# Patient Record
Sex: Female | Born: 1975 | Race: Black or African American | Hispanic: No | Marital: Married | Smoking: Never smoker
Health system: Southern US, Community
[De-identification: ages and names within clinical notes are randomized; demographics above are authoritative.]

## PROBLEM LIST (undated history)

## (undated) DIAGNOSIS — M069 Rheumatoid arthritis, unspecified: Secondary | ICD-10-CM

## (undated) DIAGNOSIS — C50919 Malignant neoplasm of unspecified site of unspecified female breast: Secondary | ICD-10-CM

## (undated) HISTORY — PX: FOOT SURGERY: SHX648

## (undated) HISTORY — DX: Malignant neoplasm of unspecified site of unspecified female breast: C50.919

## (undated) HISTORY — PX: TUBAL LIGATION: SHX77

---

## 2014-01-03 ENCOUNTER — Ambulatory Visit: Payer: Self-pay | Admitting: Podiatry

## 2014-01-17 ENCOUNTER — Ambulatory Visit: Payer: Self-pay | Admitting: Podiatry

## 2014-02-03 ENCOUNTER — Ambulatory Visit (INDEPENDENT_AMBULATORY_CARE_PROVIDER_SITE_OTHER): Payer: Managed Care, Other (non HMO) | Admitting: Podiatry

## 2014-02-03 ENCOUNTER — Ambulatory Visit (INDEPENDENT_AMBULATORY_CARE_PROVIDER_SITE_OTHER): Payer: Managed Care, Other (non HMO)

## 2014-02-03 ENCOUNTER — Encounter: Payer: Self-pay | Admitting: Podiatry

## 2014-02-03 VITALS — BP 123/87 | HR 70 | Resp 12

## 2014-02-03 DIAGNOSIS — R52 Pain, unspecified: Secondary | ICD-10-CM

## 2014-02-03 NOTE — Progress Notes (Signed)
Subjective:     Patient ID: Jaime Howard, female   DOB: 08-Sep-1976, 38 y.o.   MRN: [id number]  Toe Pain    patient states this right fifth toe is driving me crazy. States that she's had this for a long time and has had periodic injections and did have surgery 1 time a number of years ago which did not solve the problem. She would like to get it fixed permanently due to the continuation of discomfort in the toe   Review of Systems  All other systems reviewed and are negative.      Objective:   Physical Exam  Nursing note and vitals reviewed. Constitutional: She is oriented to person, place, and time.  Cardiovascular: Intact distal pulses.   Musculoskeletal: Normal range of motion.  Neurological: She is oriented to person, place, and time.  Skin: Skin is warm.   neurovascular status intact muscle strength adequate with range of motion of subtalar midtarsal joint within normal limits. Patient's right fifth toe is inflamed with keratotic lesion and fluid buildup and it very tender when pressed. 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 Discussed condition and treatment options and she wants this fixed but cannot do it for several months and is desperate for short-term relief. I reviewed arthroplasty and allowed her to read consent form since she drives an hour to get here and I did explain all possible complications as outlined and the told recovery. We'll take 6 months to one year. She wants surgery and signed consent form in today I did infiltrate 60 mg Xylocaine Marcaine mixture injected with a small amount of dexamethasone Kenalog 2 mg and debrided the lesion for temporary relief. Call with any questions she should have prior to surgery in June

## 2014-02-03 NOTE — Progress Notes (Signed)
   Subjective:    Patient ID: Jaime Howard, female    DOB: 02/14/1976, 38 y.o.   MRN: [id number]  HPI PT STATED RT FOOT 5TH TOE IS BEEN HURTING FOR 4 YEARS. THE TOE IS GETTING WORSE. THE TOE GET AGGRAVATED BY WEARING SHOES. TRIED CORTISONE SHOT AND IT HELPS SOME.    Review of Systems  All other systems reviewed and are negative.      Objective:   Physical Exam        Assessment & Plan:

## 2014-02-03 NOTE — Patient Instructions (Signed)
Pre-Operative Instructions  Congratulations, you have decided to take an important step to improving your quality of life.  You can be assured that the doctors of [facility] will be with you every step of the way.  1. Plan to be at the surgery center/hospital at least 1 (one) hour prior to your scheduled time unless otherwise directed by the surgical center/hospital staff.  You must have a responsible adult accompany you, remain during the surgery and drive you home.  Make sure you have directions to the surgical center/hospital and know how to get there on time. 2. For hospital based surgery you will need to obtain a history and physical form from your family physician within 1 month prior to the date of surgery- we will give you a form for you primary physician.  3. We make every effort to accommodate the date you request for surgery.  There are however, times where surgery dates or times have to be moved.  We will contact you as soon as possible if a change in schedule is required.   4. No Aspirin/Ibuprofen for one week before surgery.  If you are on aspirin, any non-steroidal anti-inflammatory medications (Mobic, Aleve, Ibuprofen) you should stop taking it 7 days prior to your surgery.  You make take Tylenol  For pain prior to surgery.  5. Medications- If you are taking daily heart and blood pressure medications, seizure, reflux, allergy, asthma, anxiety, pain or diabetes medications, make sure the surgery center/hospital is aware before the day of surgery so they may notify you which medications to take or avoid the day of surgery. 6. No food or drink after midnight the night before surgery unless directed otherwise by surgical center/hospital staff. 7. No alcoholic beverages 24 hours prior to surgery.  No smoking 24 hours prior to or 24 hours after surgery. 8. Wear loose pants or shorts- loose enough to fit over bandages, boots, and casts. 9. No slip on shoes, sneakers are best. 10. Bring  your boot with you to the surgery center/hospital.  Also bring crutches or a walker if your physician has prescribed it for you.  If you do not have this equipment, it will be provided for you after surgery. 11. If you have not been contracted by the surgery center/hospital by the day before your surgery, call to confirm the date and time of your surgery. 12. Leave-time from work may vary depending on the type of surgery you have.  Appropriate arrangements should be made prior to surgery with your employer. 13. Prescriptions will be provided immediately following surgery by your doctor.  Have these filled as soon as possible after surgery and take the medication as directed. 14. Remove nail polish on the operative foot. 15. Wash the night before surgery.  The night before surgery wash the foot and leg well with the antibacterial soap provided and water paying special attention to beneath the toenails and in between the toes.  Rinse thoroughly with water and dry well with a towel.  Perform this wash unless told not to do so by your physician.  Enclosed: 1 Ice pack (please put in freezer the night before surgery)   1 Hibiclens skin cleaner   Pre-op Instructions  If you have any questions regarding the instructions, do not hesitate to call our office.  [place]: [address] [place], [place] [address] [phone]  [place]: [address] [place], [place] [address] [phone]  [place]: [address]  [place], [place] [address] [phone]  Dr. Richard   Tuchman DPM, Dr. Norman Regal DPM Dr. Richard Sikora DPM, Dr. M. Todd Hyatt DPM, Dr. Kathryn Egerton DPM 

## 2014-04-11 ENCOUNTER — Encounter: Payer: Managed Care, Other (non HMO) | Admitting: Podiatry

## 2014-04-12 ENCOUNTER — Encounter: Payer: Self-pay | Admitting: Podiatry

## 2014-04-12 NOTE — Progress Notes (Signed)
Dr Paulla Dolly performed at right hammertoe 5th met repair on 04/05/14.

## 2020-09-04 ENCOUNTER — Other Ambulatory Visit: Payer: Self-pay

## 2020-09-04 ENCOUNTER — Encounter (INDEPENDENT_AMBULATORY_CARE_PROVIDER_SITE_OTHER): Payer: Self-pay | Admitting: Otolaryngology

## 2020-09-04 ENCOUNTER — Ambulatory Visit (INDEPENDENT_AMBULATORY_CARE_PROVIDER_SITE_OTHER): Payer: Managed Care, Other (non HMO) | Admitting: Otolaryngology

## 2020-09-04 VITALS — Temp 98.1°F

## 2020-09-04 DIAGNOSIS — J310 Chronic rhinitis: Secondary | ICD-10-CM | POA: Diagnosis not present

## 2020-09-04 DIAGNOSIS — J329 Chronic sinusitis, unspecified: Secondary | ICD-10-CM

## 2020-09-04 NOTE — Progress Notes (Signed)
HPI: Jaime Howard is a 44 y.o. female who presents for evaluation of chronic sinus infections.  She states that she normally gets 3-4 sinus infections every year but this past year she has had 5 infections.  She has just recently finished antibiotics and steroids. She apparently had Covid in November 2020. When she has a sinus infection she gets headache and pressure in her sinuses she has a stuffy nose.  She complains of postnasal drainage. She has never had any sinus x-rays. She just recently started the process of having a dental implant placed.  No past medical history on file.  Social History   Socioeconomic History  . Marital status: Married    Spouse name: Not on file  . Number of children: Not on file  . Years of education: Not on file  . Highest education level: Not on file  Occupational History  . Not on file  Tobacco Use  . Smoking status: Never Smoker  . Smokeless tobacco: Never Used  Substance and Sexual Activity  . Alcohol use: No  . Drug use: No  . Sexual activity: Not on file  Other Topics Concern  . Not on file  Social History Narrative  . Not on file   Social Determinants of Health   Financial Resource Strain:   . Difficulty of Paying Living Expenses: Not on file  Food Insecurity:   . Worried About Charity fundraiser in the Last Year: Not on file  . Ran Out of Food in the Last Year: Not on file  Transportation Needs:   . Lack of Transportation (Medical): Not on file  . Lack of Transportation (Non-Medical): Not on file  Physical Activity:   . Days of Exercise per Week: Not on file  . Minutes of Exercise per Session: Not on file  Stress:   . Feeling of Stress : Not on file  Social Connections:   . Frequency of Communication with Friends and Family: Not on file  . Frequency of Social Gatherings with Friends and Family: Not on file  . Attends Religious Services: Not on file  . Active Member of Clubs or Organizations: Not on file  . Attends Theatre manager Meetings: Not on file  . Marital Status: Not on file   No family history on file. No Known Allergies Prior to Admission medications   Not on File     Positive ROS: Otherwise negative  All other systems have been reviewed and were otherwise negative with the exception of those mentioned in the HPI and as above.  Physical Exam: Constitutional: Alert, well-appearing, no acute distress Ears: External ears without lesions or tenderness. Ear canals are clear bilaterally with intact, clear TMs.  Nasal: External nose without lesions. Septum deviated to the right with moderate rhinitis.  After decongesting the nose no polyps noted.  Both millimeters regions appear clear..  Oral: Lips and gums without lesions. Tongue and palate mucosa without lesions. Posterior oropharynx clear. Neck: No palpable adenopathy or masses Respiratory: Breathing comfortably  Skin: No facial/neck lesions or rash noted.  Procedures  Assessment: Chronic rhinitis. History of recurrent sinusitis.  Plan: Recommended regular use of Nasacort 2 sprays each nostril at night as this should help some with nasal congestion as well as sinus pressure. We will plan on obtaining a CT scan of the sinuses since she has had so frequent sinus infections. She will call us back following the CT scan of the sinuses.  Radene Journey, MD

## 2020-09-05 ENCOUNTER — Other Ambulatory Visit (INDEPENDENT_AMBULATORY_CARE_PROVIDER_SITE_OTHER): Payer: Self-pay

## 2020-09-05 DIAGNOSIS — J329 Chronic sinusitis, unspecified: Secondary | ICD-10-CM

## 2020-09-05 DIAGNOSIS — J320 Chronic maxillary sinusitis: Secondary | ICD-10-CM

## 2020-09-05 NOTE — Progress Notes (Signed)
Ct scan 

## 2020-09-29 ENCOUNTER — Other Ambulatory Visit: Payer: Managed Care, Other (non HMO)

## 2020-09-29 ENCOUNTER — Inpatient Hospital Stay: Admission: RE | Admit: 2020-09-29 | Payer: Managed Care, Other (non HMO) | Source: Ambulatory Visit

## 2020-10-02 ENCOUNTER — Ambulatory Visit
Admission: RE | Admit: 2020-10-02 | Discharge: 2020-10-02 | Disposition: A | Discharge status: Home / Self Care | Payer: Managed Care, Other (non HMO) | Source: Ambulatory Visit | Attending: Otolaryngology | Admitting: Otolaryngology

## 2020-10-02 DIAGNOSIS — J329 Chronic sinusitis, unspecified: Secondary | ICD-10-CM

## 2020-10-20 ENCOUNTER — Telehealth (INDEPENDENT_AMBULATORY_CARE_PROVIDER_SITE_OTHER): Payer: Self-pay | Admitting: Otolaryngology

## 2020-10-20 NOTE — Telephone Encounter (Signed)
Called patient today concerning results of her recent CT scan of her sinuses.  She states that she has had frequent sinus infections and is presently having "sinus headache". I have reviewed the CT scan of the sinuses and this showed clear paranasal sinuses.  There was no mucoperiosteal thickening no air-fluid levels and no opacification of the sinuses.  OMCs were widely patent throughout. I am not sure her "sinuses" are causing the symptoms that she is experiencing that this may be more related to just allergies and rhinitis.

## 2021-06-04 IMAGING — CT CT MAXILLOFACIAL W/O CM
1 series · 16 of 30 positions shown, 20 images · non-contrast
Comparison: None.

CLINICAL DATA: Maxillofacial pain.  Chronic sinusitis.

EXAM:
CT MAXILLOFACIAL WITHOUT CONTRAST
TECHNIQUE: Multidetector CT images of the paranasal sinuses were obtained using
the standard protocol without intravenous contrast.

[Series 4: soft tissue · axial · 0.36mm/px · z∈[-39,+69]mm · 16 of 117 slices shown, 20 images]
[im 5/117  brain]
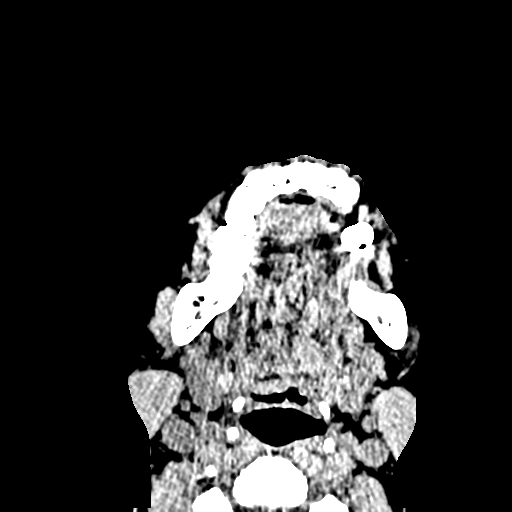
[im 5/117  bone]
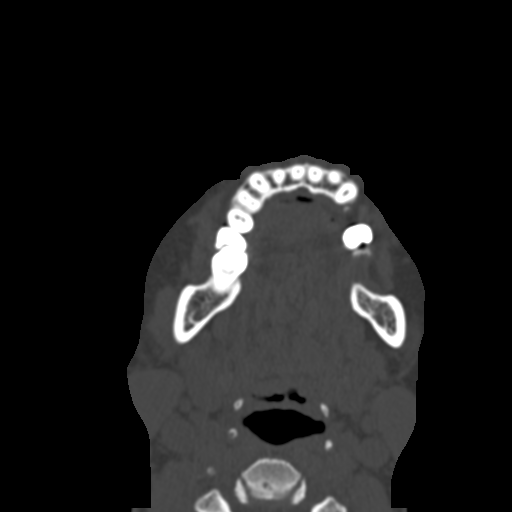
[im 13/117  bone]
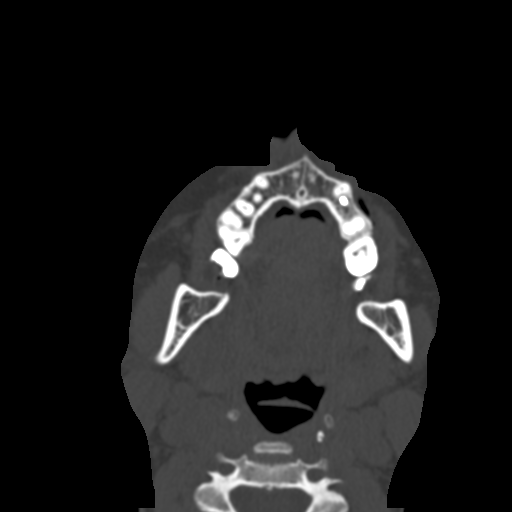
[im 21/117  bone]
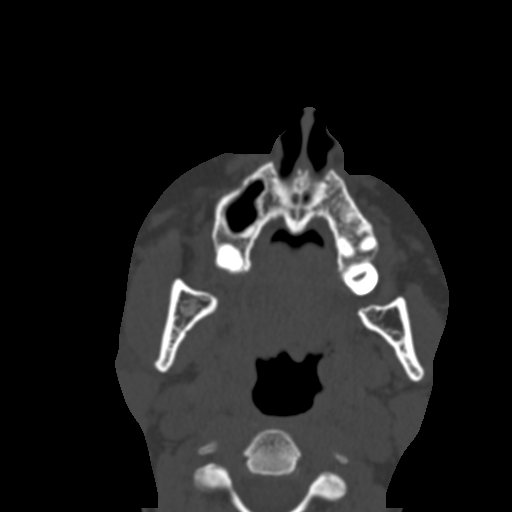
[im 29/117  bone]
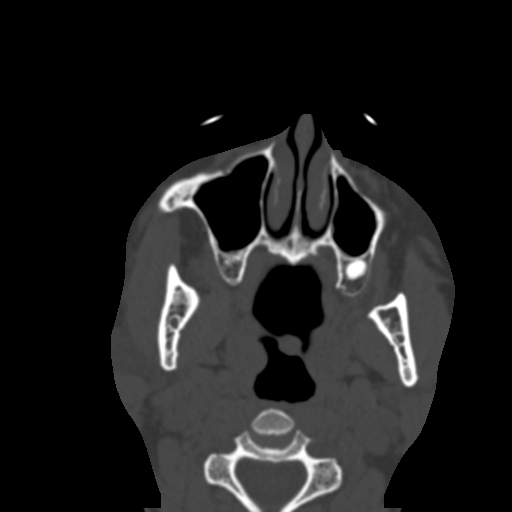
[im 33/117  brain]
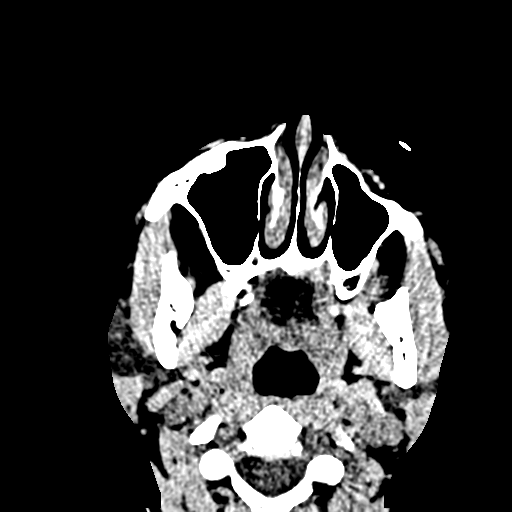
[im 33/117  bone]
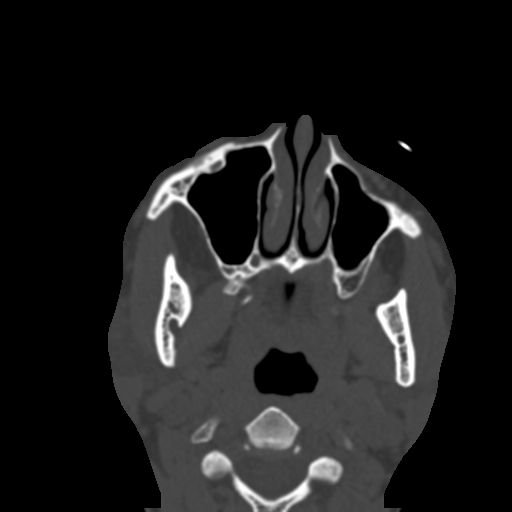
[im 41/117  bone]
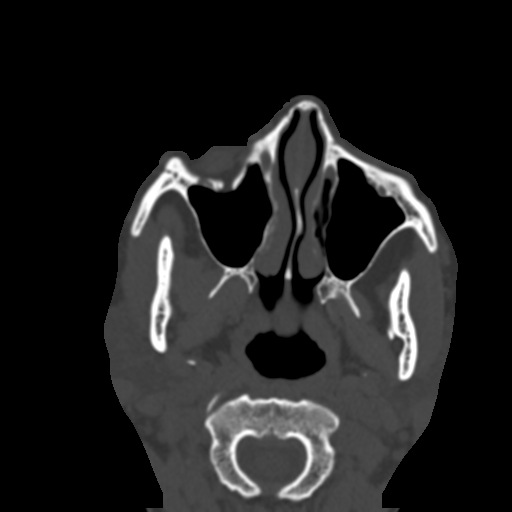
[im 49/117  bone]
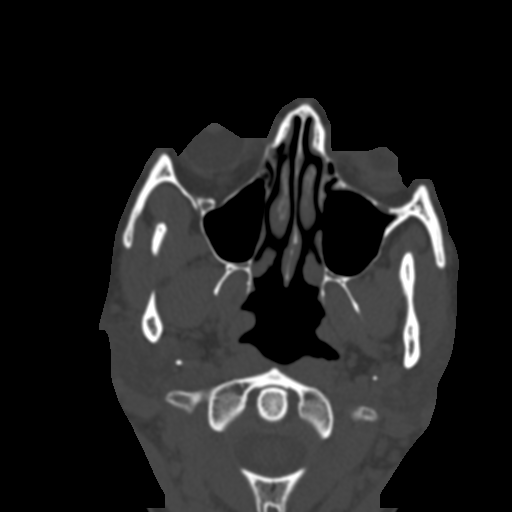
[im 57/117  bone]
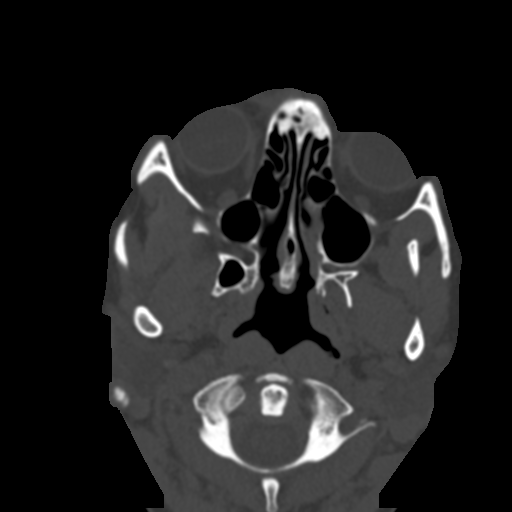
[im 61/117  brain]
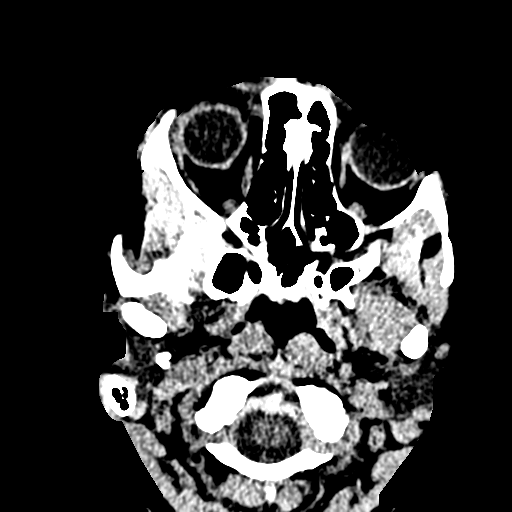
[im 61/117  bone]
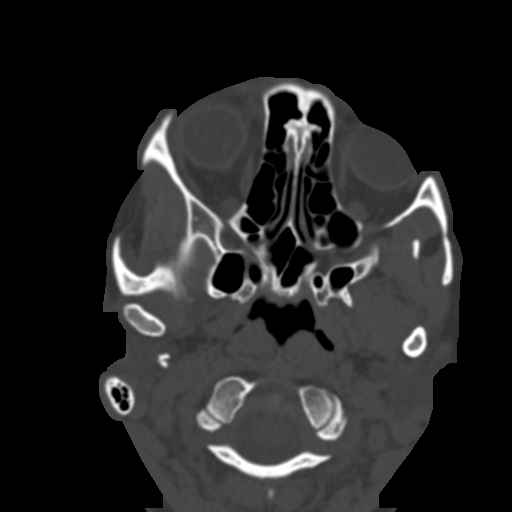
[im 69/117  bone]
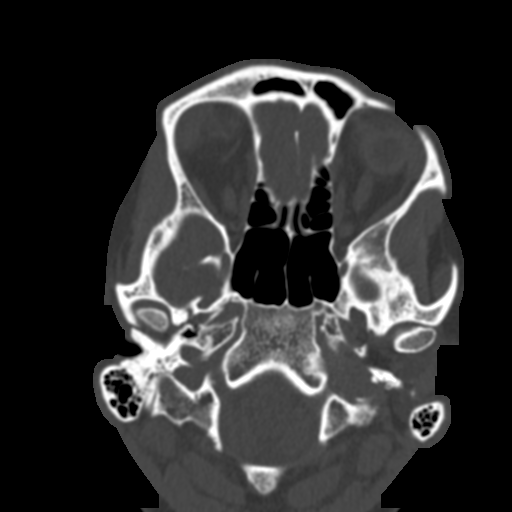
[im 77/117  bone]
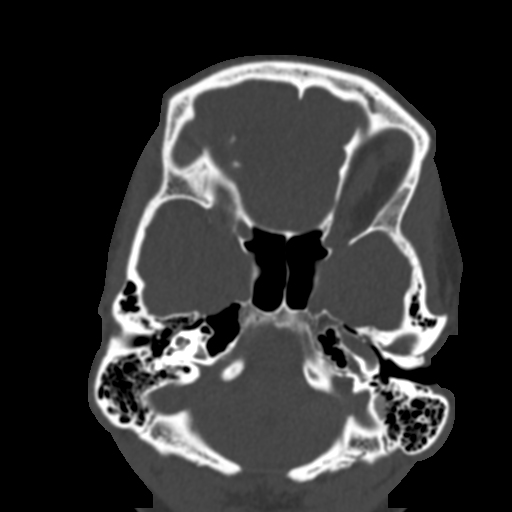
[im 85/117  bone]
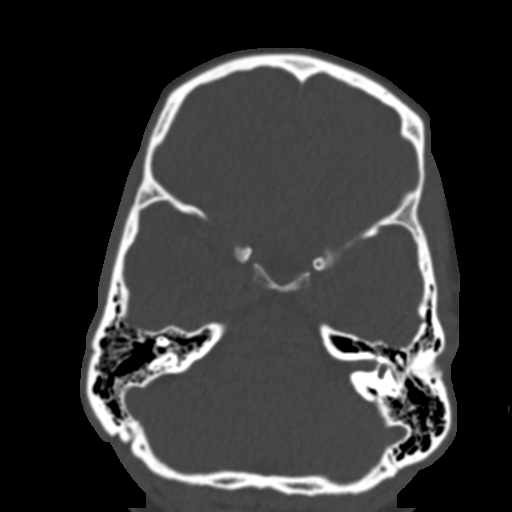
[im 89/117  brain]
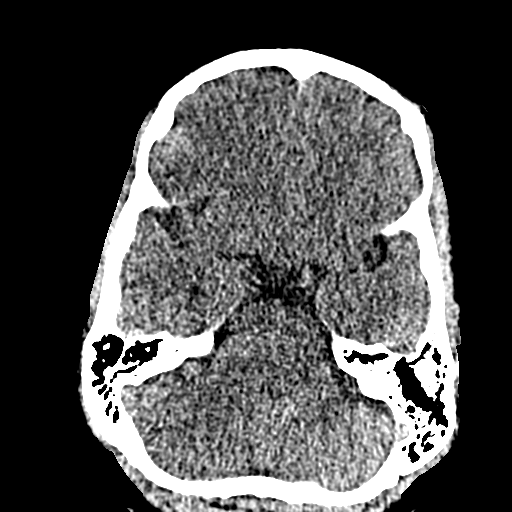
[im 89/117  bone]
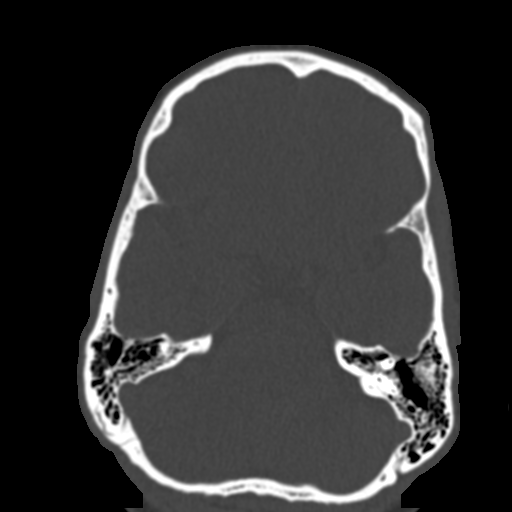
[im 97/117  bone]
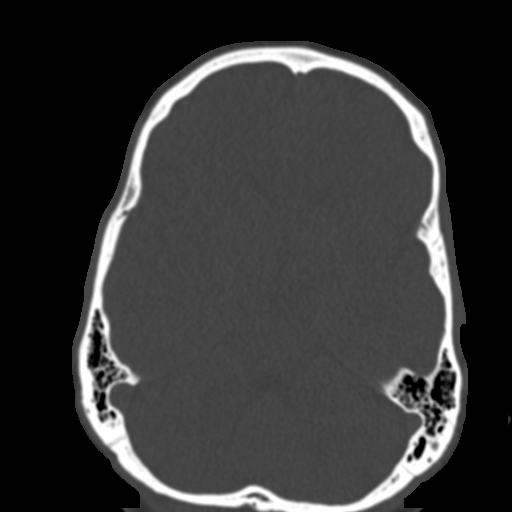
[im 105/117  bone]
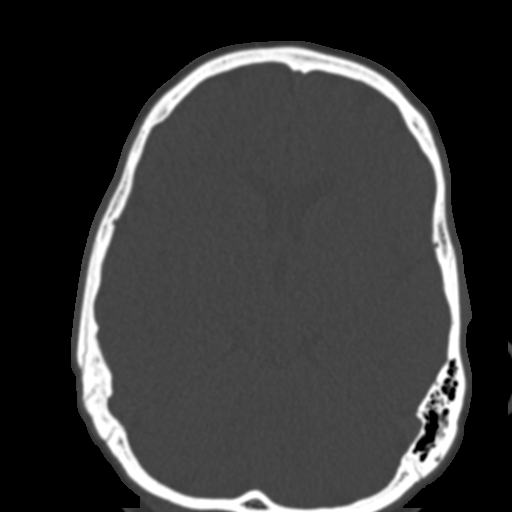
[im 113/117  bone]
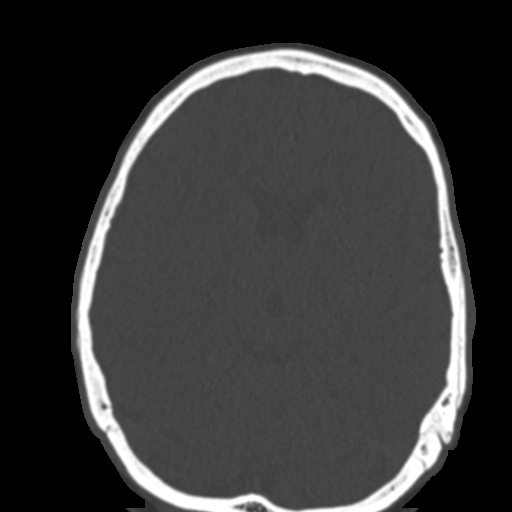

[16 of 30 positions shown; findings below may reference images not displayed]

FINDINGS: Paranasal sinuses:

Frontal: Normally aerated. Patent frontal sinus drainage pathways.

Ethmoid: Normally aerated.

Maxillary: Normally aerated.

Sphenoid: Normally aerated. Patent sphenoethmoidal recesses.

Right ostiomeatal unit: Patent.

Left ostiomeatal unit: Patent.

Nasal passages: Patent. Intact nasal septum is midline.

Anatomy: No pneumatization superior to anterior ethmoid notches.
Symmetric and intact olfactory grooves and fovea ethmoidalis, Keros
II (4-7mm). Sellar sphenoid pneumatization pattern. No dehiscence of
carotid or optic canals. No onodi cell.

Other: Orbits and intracranial compartment are unremarkable. Visible
mastoid air cells are normally aerated.
IMPRESSION: Normally aerated paranasal sinuses. Patent sinus drainage pathways.

## 2022-03-21 ENCOUNTER — Telehealth: Payer: Self-pay | Admitting: *Deleted

## 2022-03-21 ENCOUNTER — Encounter: Payer: Self-pay | Admitting: *Deleted

## 2022-03-21 DIAGNOSIS — D0512 Intraductal carcinoma in situ of left breast: Secondary | ICD-10-CM

## 2022-03-21 NOTE — Telephone Encounter (Signed)
Left message for a return phone call to scheduled [facility] appt for 6/14

## 2022-03-21 NOTE — Telephone Encounter (Signed)
Received return phone call from patient and confirmed appt for Jaime Howard - [facility] 03/27/22 at 12:15pm.  Discussed navigation resources and provided contact information. New patient packet emailed to patient. Encouraged her to call should she have any questions or concerns. Patient verbalized understanding.

## 2022-03-22 ENCOUNTER — Encounter: Payer: Self-pay | Admitting: Obstetrics and Gynecology

## 2022-03-26 NOTE — Progress Notes (Signed)
Radiation Oncology         [phone] ________________________________  Multidisciplinary Breast Oncology Clinic [facility]) Initial Outpatient Consultation  Name: Jaime Howard MRN: [id number]  Date: 03/27/2022  DOB: May 13, 1976  OV:FIEPP-IRJJOA, Tresa Res, MD  Rolm Bookbinder, MD   REFERRING PHYSICIAN: Rolm Bookbinder, MD  DIAGNOSIS: There were no encounter diagnoses.  Stage *** Left Breast ***Q, *** Carcinoma, ER*** / PR*** / Her2***, Grade ***  No diagnosis found.  HISTORY OF PRESENT ILLNESS::Jaime Howard is a 46 y.o. female who is presenting to the office today for evaluation of her newly diagnosed breast cancer. She is accompanied by ***. She is doing well overall.   She had routine screening mammography on 02/27/22 showing a possible abnormality in the left breast. She underwent unilateral left breast diagnostic mammography with tomography at [facility] on 03/06/22 showing: segmental pleomorphic calcifications in the left breast suspicious of malignancy, measuring 4 cm.  Biopsy on *** showed: ***. Prognostic indicators significant for: estrogen receptor, ***% {positive or negative} and progesterone receptor, ***% {positive or negative}, both with {include note beside percentage here/strong staining intensity}. Proliferation marker Ki67 at ***%. HER2 {positive or negative}.  Menarche: *** years old Age at first live birth: *** years old GP: *** LMP: *** Contraceptive: *** HRT: ***   The patient was referred today for presentation in the multidisciplinary conference.  Radiology studies and pathology slides were presented there for review and discussion of treatment options.  A consensus was discussed regarding potential next steps.  PREVIOUS RADIATION THERAPY: {EXAM; YES/NO:19492::"No"}  PAST MEDICAL HISTORY: No past medical history on file.  PAST SURGICAL HISTORY:*** The histories are not reviewed yet. Please review them in the "History" navigator section and refresh this  [place].  FAMILY HISTORY: No family history on file.  SOCIAL HISTORY:  Social History   Socioeconomic History   Marital status: Married    Spouse name: Not on file   Number of children: Not on file   Years of education: Not on file   Highest education level: Not on file  Occupational History   Not on file  Tobacco Use   Smoking status: Never   Smokeless tobacco: Never  Substance and Sexual Activity   Alcohol use: No   Drug use: No   Sexual activity: Not on file  Other Topics Concern   Not on file  Social History Narrative   Not on file   Social Determinants of Health   Financial Resource Strain: Not on file  Food Insecurity: Not on file  Transportation Needs: Not on file  Physical Activity: Not on file  Stress: Not on file  Social Connections: Not on file    ALLERGIES: No Known Allergies  MEDICATIONS:  No current outpatient medications on file.   No current facility-administered medications for this encounter.    REVIEW OF SYSTEMS: A 10+ POINT REVIEW OF SYSTEMS WAS OBTAINED including neurology, dermatology, psychiatry, cardiac, respiratory, lymph, extremities, GI, GU, musculoskeletal, constitutional, reproductive, HEENT. On the provided form, she reports ***. She denies *** and any other symptoms.    PHYSICAL EXAM:  vitals were not taken for this visit.  {may need to copy over vitals} Lungs are clear to auscultation bilaterally. Heart has regular rate and rhythm. No palpable cervical, supraclavicular, or axillary adenopathy. Abdomen soft, non-tender, normal bowel sounds. Breast: *** breast with no palpable mass, nipple discharge, or bleeding. *** breast with ***.   KPS = ***  100 - Normal; no complaints; no evidence of disease. 90   - Able  to carry on normal activity; minor signs or symptoms of disease. 80   - Normal activity with effort; some signs or symptoms of disease. 23   - Cares for self; unable to carry on normal activity or to do active work. 60   -  Requires occasional assistance, but is able to care for most of his personal needs. 50   - Requires considerable assistance and frequent medical care. 34   - Disabled; requires special care and assistance. 26   - Severely disabled; hospital admission is indicated although death not imminent. 2   - Very sick; hospital admission necessary; active supportive treatment necessary. 10   - Moribund; fatal processes progressing rapidly. 0     - Dead  Karnofsky DA, Abelmann WH, Craver LS and Burchenal JH [phone]) The use of the nitrogen mustards in the palliative treatment of carcinoma: with particular reference to bronchogenic carcinoma Cancer 1 634-56  LABORATORY DATA:  No results found for: "WBC", "HGB", "HCT", "MCV", "PLT" No results found for: "NA", "K", "CL", "CO2" No results found for: "ALT", "AST", "GGT", "ALKPHOS", "BILITOT"  PULMONARY FUNCTION TEST:   Review Flowsheet        No data to display          RADIOGRAPHY: No results found.    IMPRESSION: *** {DIAGNOSIS HERE}  Patient will be a good candidate for breast conservation with radiotherapy to *** breast. We discussed the general course of radiation, potential side effects, and toxicities with radiation and the patient is interested in this approach. ***   PLAN:  *** (copy notes from board at conference)   ------------------------------------------------  Blair Promise, PhD, MD  This document serves as a record of services personally performed by Gery Pray, MD. It was created on his behalf by Roney Mans, a trained medical scribe. The creation of this record is based on the scribe's personal observations and the provider's statements to them. This document has been checked and approved by the attending provider.

## 2022-03-27 ENCOUNTER — Ambulatory Visit: Payer: Managed Care, Other (non HMO) | Admitting: Physical Therapy

## 2022-03-27 ENCOUNTER — Encounter: Payer: Self-pay | Admitting: Hematology

## 2022-03-27 ENCOUNTER — Other Ambulatory Visit: Payer: Self-pay

## 2022-03-27 ENCOUNTER — Encounter: Payer: Self-pay | Admitting: Radiology

## 2022-03-27 ENCOUNTER — Ambulatory Visit
Admission: RE | Admit: 2022-03-27 | Discharge: 2022-03-27 | Disposition: A | Discharge status: Home / Self Care | Payer: Managed Care, Other (non HMO) | Source: Ambulatory Visit | Attending: Radiation Oncology | Admitting: Radiation Oncology

## 2022-03-27 ENCOUNTER — Other Ambulatory Visit: Payer: Self-pay | Admitting: General Surgery

## 2022-03-27 ENCOUNTER — Inpatient Hospital Stay: Payer: Managed Care, Other (non HMO) | Attending: Hematology | Admitting: Hematology

## 2022-03-27 ENCOUNTER — Inpatient Hospital Stay: Payer: Managed Care, Other (non HMO)

## 2022-03-27 ENCOUNTER — Inpatient Hospital Stay (HOSPITAL_BASED_OUTPATIENT_CLINIC_OR_DEPARTMENT_OTHER): Payer: Managed Care, Other (non HMO) | Admitting: Genetic Counselor

## 2022-03-27 VITALS — BP 121/83 | HR 64 | Temp 97.7°F | Resp 18 | Wt 142.1 lb

## 2022-03-27 DIAGNOSIS — D0512 Intraductal carcinoma in situ of left breast: Secondary | ICD-10-CM

## 2022-03-27 DIAGNOSIS — Z170 Estrogen receptor positive status [ER+]: Secondary | ICD-10-CM | POA: Diagnosis not present

## 2022-03-27 DIAGNOSIS — M069 Rheumatoid arthritis, unspecified: Secondary | ICD-10-CM | POA: Insufficient documentation

## 2022-03-27 DIAGNOSIS — Z800 Family history of malignant neoplasm of digestive organs: Secondary | ICD-10-CM

## 2022-03-27 LAB — CBC WITH DIFFERENTIAL (CANCER CENTER ONLY)
Abs Immature Granulocytes: 0.01 10*3/uL (ref 0.00–0.07)
Basophils Absolute: 0 10*3/uL (ref 0.0–0.1)
Basophils Relative: 1 %
Eosinophils Absolute: 0.1 10*3/uL (ref 0.0–0.5)
Eosinophils Relative: 1 %
HCT: 44.9 % (ref 36.0–46.0)
Hemoglobin: 15.1 g/dL — ABNORMAL HIGH (ref 12.0–15.0)
Immature Granulocytes: 0 %
Lymphocytes Relative: 28 %
Lymphs Abs: 1.9 10*3/uL (ref 0.7–4.0)
MCH: 29.4 pg (ref 26.0–34.0)
MCHC: 33.6 g/dL (ref 30.0–36.0)
MCV: 87.4 fL (ref 80.0–100.0)
Monocytes Absolute: 0.5 10*3/uL (ref 0.1–1.0)
Monocytes Relative: 7 %
Neutro Abs: 4.5 10*3/uL (ref 1.7–7.7)
Neutrophils Relative %: 63 %
Platelet Count: 182 10*3/uL (ref 150–400)
RBC: 5.14 MIL/uL — ABNORMAL HIGH (ref 3.87–5.11)
RDW: 12.5 % (ref 11.5–15.5)
WBC Count: 7 10*3/uL (ref 4.0–10.5)
nRBC: 0 % (ref 0.0–0.2)

## 2022-03-27 LAB — GENETIC SCREENING ORDER

## 2022-03-27 LAB — CMP (CANCER CENTER ONLY)
ALT: 7 U/L (ref 0–44)
AST: 14 U/L — ABNORMAL LOW (ref 15–41)
Albumin: 4.5 g/dL (ref 3.5–5.0)
Alkaline Phosphatase: 58 U/L (ref 38–126)
Anion gap: 7 (ref 5–15)
BUN: 12 mg/dL (ref 6–20)
CO2: 27 mmol/L (ref 22–32)
Calcium: 9.7 mg/dL (ref 8.9–10.3)
Chloride: 104 mmol/L (ref 98–111)
Creatinine: 0.88 mg/dL (ref 0.44–1.00)
GFR, Estimated: >60 mL/min (ref >60)
Glucose, Bld: 75 mg/dL (ref 70–99)
Potassium: 3.6 mmol/L (ref 3.5–5.1)
Sodium: 138 mmol/L (ref 135–145)
Total Bilirubin: 0.5 mg/dL (ref 0.3–1.2)
Total Protein: 7.9 g/dL (ref 6.5–8.1)

## 2022-03-27 NOTE — Progress Notes (Signed)
REFERRING PROVIDER: Truitt Merle, MD [address] [place],  [address]  PRIMARY PROVIDER:  Arlan Organ, MD  PRIMARY REASON FOR VISIT:  Encounter Diagnoses  Name Primary?   Ductal carcinoma in situ (DCIS) of left breast Yes   Family history of colon cancer     HISTORY OF PRESENT ILLNESS:   Ms. Chamorro, a 46 y.o. female, was seen for a [place] cancer genetics consultation during the breast multidisciplinary clinic at the request of Dr. Burr Medico due to a personal and family history of cancer.  Ms. Deoliveira presents to clinic today to discuss the possibility of a hereditary predisposition to cancer, to discuss genetic testing, and to further clarify her future cancer risks, as well as potential cancer risks for family members.   In June 2023, at the age of 53, Ms. Mccown was diagnosed with ductal carcinoma in situ of the left breast.   CANCER HISTORY:  Oncology History  Ductal carcinoma in situ (DCIS) of left breast  03/06/2022 Mammogram   Exam: 3D Mammogram Diagnostic - Left  IMPRESSION: The 4 cm segmental pleomorphic calcifications in the left breast at 3-4 o'clock are suspicious of malignancy.   03/18/2022 Initial Biopsy   Diagnosis 1. Breast, left, needle core biopsy, calcification 3-4:00 middle depth  - DUCTAL CARCINOMA IN SITU (DCIS), APOCRINE TYPE, NUCLEAR GRADE I-II, WITH CALCIFICATIONS AND CENTRAL NECROSIS  1. PROGNOSTIC INDICATORS Results: Estrogen Receptor: 90%, POSITIVE, STRONG STAINING INTENSITY Progesterone Receptor: 50%, POSITIVE, STRONG STAINING INTENSITY   03/21/2022 Initial Diagnosis   Ductal carcinoma in situ (DCIS) of left breast     Past Medical History:  Diagnosis Date   Breast cancer [facility])     Past Surgical History:  Procedure Laterality Date   TUBAL LIGATION  11/1998    Social History   Socioeconomic History   Marital status: Married    Spouse name: Not on file   Number of children: 2   Years of education: Not on file    Highest education level: Not on file  Occupational History   Not on file  Tobacco Use   Smoking status: Never   Smokeless tobacco: Never  Substance and Sexual Activity   Alcohol use: No   Drug use: No   Sexual activity: Yes    Birth control/protection: None  Other Topics Concern   Not on file  Social History Narrative   Not on file   Social Determinants of Health   Financial Resource Strain: Not on file  Food Insecurity: Not on file  Transportation Needs: Not on file  Physical Activity: Not on file  Stress: Not on file  Social Connections: Not on file     FAMILY HISTORY:  We obtained a detailed, 4-generation family history.  Significant diagnoses are listed below: Family History  Problem Relation Age of Onset   Cancer Father 75       colon cancer      Ms. Feria's maternal cousin was diagnosed with breast cancer in her 17s. Her father was diagnosed with colon cancer at age 60 and died due to metastatic colon cancer at age 78. Ms. Suits is unaware of previous family history of genetic testing for hereditary cancer risks. There is no reported Ashkenazi Jewish ancestry.   GENETIC COUNSELING ASSESSMENT: Ms. Seifried is a 46 y.o. female with a personal and family history of cancer which is somewhat suggestive of a hereditary cancer syndrome and predisposition to cancer given her young age at diagnosis. We, therefore, discussed and recommended the following  at today's visit.   DISCUSSION: We discussed that 5 - 10% of cancer is hereditary, with most cases of hereditary breast cancer associated with mutations in BRCA1/2.  There are other genes that can be associated with hereditary breast cancer syndromes. Type of cancer risk and level of risk are gene-specific. We discussed that testing is beneficial for several reasons including knowing how to follow individuals after completing their treatment, identifying whether potential treatment options would be beneficial, and understanding if  other family members could be at risk for cancer and allowing them to undergo genetic testing.   We reviewed the characteristics, features and inheritance patterns of hereditary cancer syndromes. We also discussed genetic testing, including the appropriate family members to test, the process of testing, insurance coverage and turn-around-time for results. We discussed the implications of a negative, positive and/or variant of uncertain significant result. In order to get genetic test results in a timely manner so that Ms. Pyeatt can use these genetic test results for surgical decisions, we recommended Ms. Iyer pursue genetic testing for the BRCAplus. Once complete, we recommend Ms. Vanderford pursue reflex genetic testing to a more comprehensive gene panel.   Ms. Harrigan  was offered a common hereditary cancer panel (47 genes) and an expanded pan-cancer panel (77 genes). Ms. Cogan was informed of the benefits and limitations of each panel, including that expanded pan-cancer panels contain genes that do not have clear management guidelines at this point in time.  We also discussed that as the number of genes included on a panel increases, the chances of variants of uncertain significance increases.  After considering the benefits and limitations of each gene panel, Ms. Besaw elected to have [facility] CancerNext-Expanded Panel.  The CancerNext-Expanded gene panel offered by [facility] and includes sequencing, rearrangement, and RNA analysis for the following 77 genes: AIP, ALK, APC, ATM, AXIN2, BAP1, BARD1, BLM, BMPR1A, BRCA1, BRCA2, BRIP1, CDC73, CDH1, CDK4, CDKN1B, CDKN2A, CHEK2, CTNNA1, DICER1, FANCC, FH, FLCN, GALNT12, KIF1B, LZTR1, MAX, MEN1, MET, MLH1, MSH2, MSH3, MSH6, MUTYH, NBN, NF1, NF2, NTHL1, PALB2, PHOX2B, PMS2, POT1, PRKAR1A, PTCH1, PTEN, RAD51C, RAD51D, RB1, RECQL, RET, SDHA, SDHAF2, SDHB, SDHC, SDHD, SMAD4, SMARCA4, SMARCB1, SMARCE1, STK11, SUFU, TMEM127, TP53, TSC1, TSC2, VHL and XRCC2  (sequencing and deletion/duplication); EGFR, EGLN1, HOXB13, KIT, MITF, PDGFRA, POLD1, and POLE (sequencing only); EPCAM and GREM1 (deletion/duplication only).   Based on Ms. Halterman's personal and family history of cancer, she meets medical criteria for genetic testing. Despite that she meets criteria, she may still have an out of pocket cost. We discussed that if her out of pocket cost for testing is over $100, the laboratory should contact them to discuss self-pay prices, patient pay assistance programs, if applicable, and other billing options.   PLAN: After considering the risks, benefits, and limitations, Ms. Lukins provided informed consent to pursue genetic testing and the blood sample was sent to [facility] for analysis of the CancerNext-Expanded Panel. Results should be available within approximately 1-2 weeks' time, at which point they will be disclosed by telephone to Ms. Kotz, as will any additional recommendations warranted by these results. Ms. Mcmullan will receive a summary of her genetic counseling visit and a copy of her results once available. This information will also be available in Epic.   Ms. Newmann questions were answered to her satisfaction today. Our contact information was provided should additional questions or concerns arise. Thank you for the referral and allowing [place] to share in the care of your patient.   Mel Almond  Jessyka Austria, [place], [facility] Genetic Counselor [email or web address] (P) [phone]  The patient was seen for a total of 20 minutes in face-to-face genetic counseling.  The patient brought her husband. Drs. Lindi Adie and/or Burr Medico were available to discuss this case as needed.  _______________________________________________________________________ For Office Staff:  Number of people involved in session: 2 Was an Intern/ student involved with case: no

## 2022-03-27 NOTE — Research (Signed)
MTG-015 - Tissue and Bodily Fluids: Translational Medicine: Discovery and Evaluation of Biomarkers/Pharmacogenomics for the Diagnosis and Personalized Management of Patients    03/27/22  CONSENT INTRO: Patient Jaime Howard was identified by Dr. Burr Medico as a potential candidate for the above listed study.  This Clinical Research Coordinator met with Jaime Howard, [id number], on 03/27/22 in a manner and location that ensures patient privacy to discuss participation in the above listed research study.  Patient is Accompanied by her husband Jaime Howard .  A copy of the informed consent document and separate HIPAA Authorization was provided to the patient.  Patient reads, speaks, and understands [place].   Patient was provided with the business card of this Coordinator and encouraged to contact the research team with any questions.  Approximately 10 minutes were spent with the patient reviewing the informed consent documents.  Patient was provided the option of taking informed consent documents home to review and was encouraged to review at their convenience with their support network, including other care providers. Patient took the consent documents home to review.Plan to contact patient later this week to discuss potential questions or interest. Patient and husband were thanked for their time.   Carol Ada, RT(R)(T) Clinical Research Coordinator

## 2022-03-27 NOTE — Progress Notes (Signed)
[place]   Telephone:([phone] Fax:([phone] [place] Note   Patient Care Team: Arlan Organ, MD as PCP - General (Obstetrics and Gynecology) Rockwell Germany, RN as Oncology Nurse Navigator Mauro Kaufmann, RN as Oncology Nurse Navigator Rolm Bookbinder, MD as Consulting Physician (General Surgery) Truitt Merle, MD as Consulting Physician (Hematology) Gery Pray, MD as Consulting Physician (Radiation Oncology)  Date of Service:  03/27/2022   CHIEF COMPLAINTS/PURPOSE OF CONSULTATION:  Left Breast DCIS, ER+  REFERRING PHYSICIAN:  [facility]   ASSESSMENT & PLAN:  Jaime Howard is a 46 y.o. pre/peri?-menopausal female with a history of RA  1. Left breast DCIS, grade 1-2, ER+/PR+ -found on screening mammogram. Left diagnostic MM 03/06/22 showed 4 cm calcifications. Biopsy on 03/18/22 confirmed DCIS. -I discussed her breast imaging and needle biopsy results with patient and her family members in great detail. -She is a candidate for breast conservation surgery. She has been seen by breast surgeon Dr. Donne Hazel today, who recommends lumpectomy. -Her DCIS will be cured by complete surgical resection. Any form of adjuvant therapy is preventive. -I reviewed her risk and treatment benefits using the Breast Cancer Nomogram from [facility]). Based on family, PMx and lifestyle, she has a 17% risk of developing future breast cancer in the next 10 years and roughly 30% lifetime risk given her young age. Her risk would drop to 7% with RT alone or 9% with Antiestrogen therapy alone. With both adjuvant treatments her risk would decrease to 3% in 10 years and roughly 10% in her lifetime.  -Given her strongly positive ER and PR, I recommend antiestrogen therapy with tamoxifen, which decrease her risk of future breast cancer by ~40%.  Given the recent clinical data, I recommend a low-dose 10 mg daily for 5 years --The potential  side effects, which includes but not limited to, hot flash, skin and vaginal dryness, slightly increased risk of cardiovascular disease and cataract, small risk of thrombosis and endometrial cancer, were discussed with her in great details. Preventive strategies for thrombosis, such as being physically active, using compression stocks, avoid cigarette smoking, etc., were reviewed with her. I also recommend her to follow-up with her gynecologist once a year, and watch for abnormal vaginal spotting or bleeding, as a clinically sign of endometrial cancer, etc. She voiced good understanding, and is interested -She will likely benefit from breast radiation if she undergo lumpectomy to decrease the risk of breast cancer. She will discuss this further with Dr. Lisbeth Renshaw today.  -We also discussed that biopsy may have sampling limitation, we will review her surgical path, to see if she has any invasive carcinoma components. -We discussed breast cancer surveillance after she completes treatment, Including annual mammogram, breast exam every 6-12 months.   PLAN:  -proceed with lumpectomy -f/u at the end of radiation, to start her on tamoxifen.   Oncology History  Ductal carcinoma in situ (DCIS) of left breast  03/06/2022 Mammogram   Exam: 3D Mammogram Diagnostic - Left  IMPRESSION: The 4 cm segmental pleomorphic calcifications in the left breast at 3-4 o'clock are suspicious of malignancy.   03/18/2022 Initial Biopsy   Diagnosis 1. Breast, left, needle core biopsy, calcification 3-4:00 middle depth  - DUCTAL CARCINOMA IN SITU (DCIS), APOCRINE TYPE, NUCLEAR GRADE I-II, WITH CALCIFICATIONS AND CENTRAL NECROSIS  1. PROGNOSTIC INDICATORS Results: Estrogen Receptor: 90%, POSITIVE, STRONG STAINING INTENSITY Progesterone Receptor: 50%, POSITIVE, STRONG STAINING INTENSITY   03/21/2022 Initial Diagnosis   Ductal carcinoma  in situ (DCIS) of left breast      HISTORY OF PRESENTING ILLNESS:  Jaime Howard 46  y.o. female is a here because of breast cancer. The patient was referred by [facility]. The patient presents to the clinic today accompanied by her husband.   She had routine screening mammography on 02/27/22 showing a possible abnormality in the left breast. She underwent left diagnostic mammography on 03/06/22 showing: 4 cm segmental calcifications at 3-4 o'clock.  Biopsy on 03/18/22 showed: DCIS, grade 1-2. Prognostic indicators significant for: estrogen receptor, 90% positive and progesterone receptor, 50% positive.    Today the patient notes they felt/feeling prior/after... -she notes some itching to the biopsy site -otherwise, no other breast or any other concerns  She has a PMHx of.... -rheumatoid arthritis, not currently on medication-- previously on methotrexate, maloxicam, has come off with change in diet  Socially... -she works as a Investment banker, operational for people with IDD -she is married with two children -she reports colon cancer in her father -never smoker, does not drink alcohol   GYN HISTORY  Menarchal: 46 years old LMP: 03/18/22, starting to become irregular Contraceptive: prior use of pill for several years, now s/p BTL HRT: n/a G2P2: first at age 24   REVIEW OF SYSTEMS:    Constitutional: Denies fevers, chills or abnormal night sweats Eyes: Denies blurriness of vision, double vision or watery eyes Ears, nose, mouth, throat, and face: Denies mucositis or sore throat Respiratory: Denies cough, dyspnea or wheezes Cardiovascular: Denies palpitation, chest discomfort or lower extremity swelling Gastrointestinal:  Denies nausea, heartburn or change in bowel habits Skin: Denies abnormal skin rashes Lymphatics: Denies new lymphadenopathy or easy bruising Neurological:Denies numbness, tingling or new weaknesses Behavioral/Psych: Mood is stable, no new changes  All other systems were reviewed with the patient and are negative.   MEDICAL HISTORY:  Past Medical History:   Diagnosis Date   Breast cancer ([facility])     SURGICAL HISTORY: Past Surgical History:  Procedure Laterality Date   TUBAL LIGATION  11/1998    SOCIAL HISTORY: Social History   Socioeconomic History   Marital status: Married    Spouse name: Not on file   Number of children: 2   Years of education: Not on file   Highest education level: Not on file  Occupational History   Not on file  Tobacco Use   Smoking status: Never   Smokeless tobacco: Never  Substance and Sexual Activity   Alcohol use: No   Drug use: No   Sexual activity: Yes    Birth control/protection: None  Other Topics Concern   Not on file  Social History Narrative   Not on file   Social Determinants of Health   Financial Resource Strain: Not on file  Food Insecurity: Not on file  Transportation Needs: Not on file  Physical Activity: Not on file  Stress: Not on file  Social Connections: Not on file  Intimate Partner Violence: Not on file    FAMILY HISTORY: Family History  Problem Relation Age of Onset   Cancer Father 81       colon cancer    ALLERGIES:  has No Known Allergies.  MEDICATIONS:  Current Outpatient Medications  Medication Sig Dispense Refill   acetaminophen (TYLENOL) 325 MG tablet Take 650 mg by mouth every 6 (six) hours as needed for mild pain or headache.     meloxicam (MOBIC) 15 MG tablet Take 15 mg by mouth as needed for pain.     No  current facility-administered medications for this visit.    PHYSICAL EXAMINATION: ECOG PERFORMANCE STATUS: 0 - Asymptomatic  Vitals:   03/27/22 1256  BP: 121/83  Pulse: 64  Resp: 18  Temp: 97.7 F (36.5 C)  SpO2: 100%   Filed Weights   03/27/22 1256  Weight: 142 lb 1.6 oz (64.5 kg)    GENERAL:alert, no distress and comfortable SKIN: skin color, texture, turgor are normal, no rashes or significant lesions EYES: normal, Conjunctiva are pink and non-injected, sclera clear  NECK: supple, thyroid normal size, non-tender, without  nodularity LYMPH:  no palpable lymphadenopathy in the cervical, axillary LUNGS: clear to auscultation and percussion with normal breathing effort HEART: regular rate & rhythm and no murmurs and no lower extremity edema ABDOMEN:abdomen soft, non-tender and normal bowel sounds Musculoskeletal:no cyanosis of digits and no clubbing  NEURO: alert & oriented x 3 with fluent speech, no focal motor/sensory deficits BREAST: (+) tenderness to biopsy site, (+) some palpable nodularity, likely secondary to biopsy. No discrete mass.  LABORATORY DATA:  I have reviewed the data as listed    Latest Ref Rng & Units 03/27/2022   12:10 PM  CBC  WBC 4.0 - 10.5 K/uL 7.0   Hemoglobin 12.0 - 15.0 g/dL 15.1   Hematocrit 36.0 - 46.0 % 44.9   Platelets 150 - 400 K/uL 182        Latest Ref Rng & Units 03/27/2022   12:10 PM  CMP  Glucose 70 - 99 mg/dL 75   BUN 6 - 20 mg/dL 12   Creatinine 0.44 - 1.00 mg/dL 0.88   Sodium 135 - 145 mmol/L 138   Potassium 3.5 - 5.1 mmol/L 3.6   Chloride 98 - 111 mmol/L 104   CO2 22 - 32 mmol/L 27   Calcium 8.9 - 10.3 mg/dL 9.7   Total Protein 6.5 - 8.1 g/dL 7.9   Total Bilirubin 0.3 - 1.2 mg/dL 0.5   Alkaline Phos 38 - 126 U/L 58   AST 15 - 41 U/L 14   ALT 0 - 44 U/L 7      RADIOGRAPHIC STUDIES: I have personally reviewed the radiological images as listed and agreed with the findings in the report. No results found.   No orders of the defined types were placed in this encounter.   All questions were answered. The patient knows to call the clinic with any problems, questions or concerns. The total time spent in the appointment was 45 minutes.     Truitt Merle, MD 03/27/2022 7:22 PM  I, Wilburn Mylar, am acting as scribe for Truitt Merle, MD.   I have reviewed the above documentation for accuracy and completeness, and I agree with the above.

## 2022-03-28 DIAGNOSIS — Z800 Family history of malignant neoplasm of digestive organs: Secondary | ICD-10-CM | POA: Insufficient documentation

## 2022-03-29 ENCOUNTER — Encounter: Payer: Self-pay | Admitting: *Deleted

## 2022-03-29 ENCOUNTER — Encounter: Payer: Self-pay | Admitting: General Practice

## 2022-03-29 NOTE — Progress Notes (Signed)
[place] Psychosocial Distress Screening Spiritual Care  Met with Lidie by phone following Breast Multidisciplinary Clinic to introduce [place] team/resources, reviewing distress screen per protocol.  The patient scored a  [unspecified]  on the Psychosocial Distress Thermometer which indicates  [unspecified]  distress. Also assessed for distress and other psychosocial needs.    03/29/2022  ONCBCN DISTRESS SCREENING   Screening Type Initial Screening   Referral to support programs Yes    Ms Riccardi was very welcoming of [place] call and eager to learn about [place] support programming, including what is virtual or hybrid, given that she lives in [place]. Faith is her number one coping tool, and she also brings deep gratitude and perspective.  Provided empathic listening, emotional support, normalization of feelings, and introduction to support programming.  Follow up needed: Yes.     [place], [place], [facility] Pager [phone] Voicemail [phone]

## 2022-03-29 NOTE — Progress Notes (Signed)
Addendum:  Plan to follow up by phone in two weeks for pastoral check-in and to make a further plan for support.

## 2022-04-01 ENCOUNTER — Telehealth: Payer: Self-pay

## 2022-04-01 ENCOUNTER — Encounter (HOSPITAL_BASED_OUTPATIENT_CLINIC_OR_DEPARTMENT_OTHER): Payer: Self-pay | Admitting: General Surgery

## 2022-04-01 ENCOUNTER — Other Ambulatory Visit: Payer: Self-pay

## 2022-04-01 NOTE — Telephone Encounter (Signed)
MTG-015 - Tissue and Bodily Fluids: Translational Medicine: Discovery and Evaluation of Biomarkers/Pharmacogenomics for the Diagnosis and Personalized Management of Patients   Called Jaime Howard to discuss MTG study. She has not gotten a chance to review the consent but will do so over the next few days. We agreed that I can follow up on Wednesday 04/03/22.  Marjie Skiff Hodge Stachnik, RN, BSN, [facility] She  Her  [place] Clinical Research Nurse [facility] Direct Dial [phone]  Pager [phone] 04/01/2022 1:38 PM

## 2022-04-03 ENCOUNTER — Telehealth: Payer: Self-pay

## 2022-04-03 ENCOUNTER — Other Ambulatory Visit: Payer: Self-pay

## 2022-04-03 ENCOUNTER — Encounter (HOSPITAL_COMMUNITY): Payer: Self-pay

## 2022-04-03 DIAGNOSIS — D0512 Intraductal carcinoma in situ of left breast: Secondary | ICD-10-CM

## 2022-04-03 NOTE — Telephone Encounter (Signed)
MTG-015 - Tissue and Bodily Fluids: Translational Medicine: Discovery and Evaluation of Biomarkers/Pharmacogenomics for the Diagnosis and Personalized Management of Patients   Spoke with Mrs Rucci regarding MTG participation. She is interested in enrolling. She will be in town tomorrow for another appt and will come to [facility] after for consent and labs.  Marjie Skiff Melaine Mcphee, RN, BSN, [facility] She  Her  Hers Clinical Research Nurse [facility] Direct Dial [phone]  Pager [phone] 04/03/2022 10:11 AM

## 2022-04-04 ENCOUNTER — Telehealth: Payer: Self-pay | Admitting: Radiation Oncology

## 2022-04-04 ENCOUNTER — Encounter: Payer: Self-pay | Admitting: *Deleted

## 2022-04-04 ENCOUNTER — Inpatient Hospital Stay: Payer: Managed Care, Other (non HMO)

## 2022-04-04 ENCOUNTER — Telehealth: Payer: Self-pay | Admitting: *Deleted

## 2022-04-04 ENCOUNTER — Other Ambulatory Visit: Payer: Self-pay

## 2022-04-04 DIAGNOSIS — D0512 Intraductal carcinoma in situ of left breast: Secondary | ICD-10-CM

## 2022-04-04 LAB — RESEARCH LABS

## 2022-04-04 NOTE — Research (Signed)
Trial Name:  MTG-015 - Tissue and Bodily Fluids: Translational Medicine: Discovery and Evaluation of Biomarkers/Pharmacogenomics for the Diagnosis and Personalized Management of Patients   Patient Jaime Howard was identified by Dr Burr Medico as a potential candidate for the above listed study.  This Clinical Research Nurse met with Jaime Howard, [id number] on 04/04/22 in a manner and location that ensures patient privacy to discuss participation in the above listed research study.  Patient is Unaccompanied.  Patient was previously provided with informed consent documents.  Patient confirmed they have read the informed consent documents.  As outlined in the informed consent form, this Nurse and Jaime Howard discussed the purpose of the research study, the investigational nature of the study, study procedures and requirements for study participation, potential risks and benefits of study participation, as well as alternatives to participation.  This study is not blinded or double-blinded. The patient understands participation is voluntary and they may withdraw from study participation at any time.  This study does not involve randomization.  This study does not involve an investigational drug or device. This study does not involve a placebo. Patient understands enrollment is pending full eligibility review.   Confidentiality and how the patient's information will be used as part of study participation were discussed.  Patient was informed there is reimbursement provided for their time and effort spent on trial participation.  The patient is encouraged to discuss research study participation with their insurance provider to determine what costs they may incur as part of study participation, including research related injury.    All questions were answered to patient's satisfaction.  The informed consent and separate HIPAA Authorization was reviewed page by page.  The patient's mental and emotional status  is appropriate to provide informed consent, and the patient verbalizes an understanding of study participation.  Patient has agreed to participate in the above listed research study and has voluntarily signed the informed consent version dated Sep 03 2021 and separate HIPAA Authorization, version 5  on 04/04/22 at 1140AM.  The patient was provided with a copy of the signed informed consent form and separate HIPAA Authorization for their reference.  No study specific procedures were obtained prior to the signing of the informed consent document.  Approximately 15 minutes were spent with the patient reviewing the informed consent documents.  Patient was not requested to complete a Release of Information form.  Patient signed for her gift card and was escorted to lab waiting. She has my contact information in case she has any questions or needs assistance from research.  Jaime Skiff Loney Peto, RN, BSN, [facility] She  Her  Hers Clinical Research Nurse [place] [phone]  Pager [phone] 04/04/2022 11:56 AM

## 2022-04-04 NOTE — Progress Notes (Signed)
MTG-015 - Tissue and Bodily Fluids: Translational Medicine: Discovery and Evaluation of Biomarkers/Pharmacogenomics for the Diagnosis and Personalized Management of Patients   This Nurse has reviewed this patient's inclusion and exclusion criteria as a second review and confirms Shawn Dannenberg is eligible for study participation.  Patient may continue with enrollment.  Foye Spurling, BSN, RN, [employer] Research Nurse II 04/04/2022

## 2022-04-04 NOTE — Telephone Encounter (Signed)
Called pt regarding [place] from 03/27/22. Left vm requesting return call for questions or needs concerning dx or treatment care plan.

## 2022-04-04 NOTE — Telephone Encounter (Signed)
Spoke with pt who stated that the plan that was discussed with Dr. Donne Hazel was to have sx followed by chemo. Pt stated that during [facility] she was advised to ask about XRT if she could not tolerate chemo. Referral from Dr. Burr Medico will be cx at this time, pt not interested in consult at this time. Pt was advised if she changes her mind to let Dr. Ernestina Penna team know so they can resend referral for Dr. Sondra Come.

## 2022-04-05 ENCOUNTER — Telehealth: Payer: Self-pay | Admitting: Hematology

## 2022-04-08 ENCOUNTER — Telehealth: Payer: Self-pay | Admitting: Genetic Counselor

## 2022-04-08 ENCOUNTER — Encounter: Payer: Self-pay | Admitting: Genetic Counselor

## 2022-04-08 ENCOUNTER — Ambulatory Visit: Payer: Self-pay | Admitting: Genetic Counselor

## 2022-04-08 DIAGNOSIS — Z1379 Encounter for other screening for genetic and chromosomal anomalies: Secondary | ICD-10-CM | POA: Insufficient documentation

## 2022-04-09 ENCOUNTER — Encounter: Payer: Self-pay | Admitting: General Practice

## 2022-04-09 MED ORDER — ENSURE PRE-SURGERY PO LIQD: 296.0000 mL | Freq: Once | ORAL | Status: DC

## 2022-04-09 NOTE — Progress Notes (Signed)
[facility] Spiritual Care Note  Left voicemail encouraging return call.   [address] Rush Barer, [place], [facility] Pager [phone] Voicemail [phone]

## 2022-04-10 ENCOUNTER — Encounter (HOSPITAL_BASED_OUTPATIENT_CLINIC_OR_DEPARTMENT_OTHER)
Admission: RE | Disposition: A | Discharge status: Home / Self Care | Payer: Self-pay | Source: Ambulatory Visit | Attending: General Surgery

## 2022-04-10 ENCOUNTER — Ambulatory Visit (HOSPITAL_BASED_OUTPATIENT_CLINIC_OR_DEPARTMENT_OTHER): Payer: Managed Care, Other (non HMO) | Admitting: Anesthesiology

## 2022-04-10 ENCOUNTER — Ambulatory Visit (HOSPITAL_BASED_OUTPATIENT_CLINIC_OR_DEPARTMENT_OTHER)
Admission: RE | Admit: 2022-04-10 | Discharge: 2022-04-10 | Disposition: A | Discharge status: Home / Self Care | Payer: Managed Care, Other (non HMO) | Source: Ambulatory Visit | Attending: General Surgery | Admitting: General Surgery

## 2022-04-10 ENCOUNTER — Other Ambulatory Visit: Payer: Self-pay

## 2022-04-10 ENCOUNTER — Encounter (HOSPITAL_BASED_OUTPATIENT_CLINIC_OR_DEPARTMENT_OTHER): Payer: Self-pay | Admitting: General Surgery

## 2022-04-10 DIAGNOSIS — N6022 Fibroadenosis of left breast: Secondary | ICD-10-CM | POA: Insufficient documentation

## 2022-04-10 DIAGNOSIS — D0512 Intraductal carcinoma in situ of left breast: Secondary | ICD-10-CM | POA: Insufficient documentation

## 2022-04-10 DIAGNOSIS — N6082 Other benign mammary dysplasias of left breast: Secondary | ICD-10-CM | POA: Insufficient documentation

## 2022-04-10 HISTORY — DX: Rheumatoid arthritis, unspecified: M06.9

## 2022-04-10 HISTORY — PX: BREAST LUMPECTOMY WITH RADIOACTIVE SEED LOCALIZATION: SHX6424

## 2022-04-10 LAB — POCT PREGNANCY, URINE: Preg Test, Ur: NEGATIVE

## 2022-04-10 SURGERY — BREAST LUMPECTOMY WITH RADIOACTIVE SEED LOCALIZATION
Anesthesia: General | Site: Breast | Laterality: Left

## 2022-04-10 MED ORDER — ACETAMINOPHEN 500 MG PO TABS: 1000.0000 mg | ORAL_TABLET | Freq: Once | ORAL | Status: DC | PRN

## 2022-04-10 MED ORDER — CEFAZOLIN SODIUM-DEXTROSE 2-4 GM/100ML-% IV SOLN
INTRAVENOUS | Status: AC
  Filled 2022-04-10: qty 100

## 2022-04-10 MED ORDER — ONDANSETRON HCL 4 MG/2ML IJ SOLN
INTRAMUSCULAR | Status: DC | PRN
  Administered 2022-04-10: 4 mg via INTRAVENOUS

## 2022-04-10 MED ORDER — FENTANYL CITRATE (PF) 100 MCG/2ML IJ SOLN
INTRAMUSCULAR | Status: AC
  Filled 2022-04-10: qty 2

## 2022-04-10 MED ORDER — MIDAZOLAM HCL 5 MG/5ML IJ SOLN
INTRAMUSCULAR | Status: DC | PRN
  Administered 2022-04-10: 2 mg via INTRAVENOUS

## 2022-04-10 MED ORDER — ONDANSETRON HCL 4 MG/2ML IJ SOLN
INTRAMUSCULAR | Status: AC
  Filled 2022-04-10: qty 2

## 2022-04-10 MED ORDER — BUPIVACAINE-EPINEPHRINE 0.25% -1:200000 IJ SOLN
INTRAMUSCULAR | Status: DC | PRN
  Administered 2022-04-10: 10 mL

## 2022-04-10 MED ORDER — ACETAMINOPHEN 160 MG/5ML PO SOLN: 1000.0000 mg | Freq: Once | ORAL | Status: DC | PRN

## 2022-04-10 MED ORDER — PHENYLEPHRINE 80 MCG/ML (10ML) SYRINGE FOR IV PUSH (FOR BLOOD PRESSURE SUPPORT)
PREFILLED_SYRINGE | INTRAVENOUS | Status: AC
  Filled 2022-04-10: qty 40

## 2022-04-10 MED ORDER — LIDOCAINE 2% (20 MG/ML) 5 ML SYRINGE
INTRAMUSCULAR | Status: AC
  Filled 2022-04-10: qty 5

## 2022-04-10 MED ORDER — CHLORHEXIDINE GLUCONATE CLOTH 2 % EX PADS: 6.0000 | MEDICATED_PAD | Freq: Once | CUTANEOUS | Status: DC

## 2022-04-10 MED ORDER — LIDOCAINE 2% (20 MG/ML) 5 ML SYRINGE
INTRAMUSCULAR | Status: AC
Start: 2022-04-10 — End: ?
  Filled 2022-04-10: qty 5

## 2022-04-10 MED ORDER — OXYCODONE HCL 5 MG/5ML PO SOLN: 5.0000 mg | Freq: Once | ORAL | Status: AC | PRN

## 2022-04-10 MED ORDER — TRAMADOL HCL 50 MG PO TABS: 50.0000 mg | ORAL_TABLET | Freq: Four times a day (QID) | ORAL | 0 refills | Status: AC | PRN

## 2022-04-10 MED ORDER — PHENYLEPHRINE 80 MCG/ML (10ML) SYRINGE FOR IV PUSH (FOR BLOOD PRESSURE SUPPORT)
PREFILLED_SYRINGE | INTRAVENOUS | Status: DC | PRN
  Administered 2022-04-10 (×2): 80 ug via INTRAVENOUS

## 2022-04-10 MED ORDER — LACTATED RINGERS IV SOLN: INTRAVENOUS | Status: DC

## 2022-04-10 MED ORDER — ACETAMINOPHEN 500 MG PO TABS
ORAL_TABLET | ORAL | Status: AC
  Filled 2022-04-10: qty 2

## 2022-04-10 MED ORDER — KETOROLAC TROMETHAMINE 15 MG/ML IJ SOLN
INTRAMUSCULAR | Status: AC
  Filled 2022-04-10: qty 1

## 2022-04-10 MED ORDER — CEFAZOLIN SODIUM 1 G IJ SOLR
INTRAMUSCULAR | Status: AC
  Filled 2022-04-10: qty 10

## 2022-04-10 MED ORDER — LIDOCAINE HCL (CARDIAC) PF 100 MG/5ML IV SOSY
PREFILLED_SYRINGE | INTRAVENOUS | Status: DC | PRN
  Administered 2022-04-10: 60 mg via INTRAVENOUS

## 2022-04-10 MED ORDER — OXYCODONE HCL 5 MG PO TABS
5.0000 mg | ORAL_TABLET | Freq: Once | ORAL | Status: AC | PRN
  Administered 2022-04-10: 5 mg via ORAL

## 2022-04-10 MED ORDER — ACETAMINOPHEN 10 MG/ML IV SOLN: 1000.0000 mg | Freq: Once | INTRAVENOUS | Status: DC | PRN

## 2022-04-10 MED ORDER — DEXAMETHASONE SODIUM PHOSPHATE 4 MG/ML IJ SOLN
INTRAMUSCULAR | Status: DC | PRN
  Administered 2022-04-10: 10 mg via INTRAVENOUS

## 2022-04-10 MED ORDER — PROPOFOL 10 MG/ML IV BOLUS
INTRAVENOUS | Status: DC | PRN
  Administered 2022-04-10: 150 mg via INTRAVENOUS

## 2022-04-10 MED ORDER — FENTANYL CITRATE (PF) 100 MCG/2ML IJ SOLN
INTRAMUSCULAR | Status: DC | PRN
  Administered 2022-04-10: 100 ug via INTRAVENOUS

## 2022-04-10 MED ORDER — 0.9 % SODIUM CHLORIDE (POUR BTL) OPTIME
TOPICAL | Status: DC | PRN
  Administered 2022-04-10: 1000 mL

## 2022-04-10 MED ORDER — PHENYLEPHRINE 80 MCG/ML (10ML) SYRINGE FOR IV PUSH (FOR BLOOD PRESSURE SUPPORT)
PREFILLED_SYRINGE | INTRAVENOUS | Status: AC
  Filled 2022-04-10: qty 10

## 2022-04-10 MED ORDER — MIDAZOLAM HCL 2 MG/2ML IJ SOLN
INTRAMUSCULAR | Status: AC
  Filled 2022-04-10: qty 2

## 2022-04-10 MED ORDER — OXYCODONE HCL 5 MG PO TABS
ORAL_TABLET | ORAL | Status: AC
  Filled 2022-04-10: qty 1

## 2022-04-10 MED ORDER — KETOROLAC TROMETHAMINE 15 MG/ML IJ SOLN
15.0000 mg | INTRAMUSCULAR | Status: AC
  Administered 2022-04-10: 15 mg via INTRAVENOUS

## 2022-04-10 MED ORDER — PROPOFOL 500 MG/50ML IV EMUL
INTRAVENOUS | Status: DC | PRN
  Administered 2022-04-10: 150 ug/kg/min via INTRAVENOUS

## 2022-04-10 MED ORDER — CEFAZOLIN SODIUM-DEXTROSE 2-4 GM/100ML-% IV SOLN
2.0000 g | INTRAVENOUS | Status: AC
  Administered 2022-04-10: 2 g via INTRAVENOUS

## 2022-04-10 MED ORDER — ACETAMINOPHEN 500 MG PO TABS
1000.0000 mg | ORAL_TABLET | ORAL | Status: AC
  Administered 2022-04-10: 1000 mg via ORAL

## 2022-04-10 MED ORDER — DEXAMETHASONE SODIUM PHOSPHATE 10 MG/ML IJ SOLN
INTRAMUSCULAR | Status: AC
  Filled 2022-04-10: qty 1

## 2022-04-10 MED ORDER — FENTANYL CITRATE (PF) 100 MCG/2ML IJ SOLN
25.0000 ug | INTRAMUSCULAR | Status: DC | PRN
  Administered 2022-04-10 (×4): 25 ug via INTRAVENOUS

## 2022-04-10 SURGICAL SUPPLY — 57 items
ADH SKN CLS APL DERMABOND .7 (GAUZE/BANDAGES/DRESSINGS) ×1
APL PRP STRL LF DISP 70% ISPRP (MISCELLANEOUS) ×1
APPLIER CLIP 9.375 MED OPEN (MISCELLANEOUS) ×2
APR CLP MED 9.3 20 MLT OPN (MISCELLANEOUS) ×1
BINDER BREAST LRG (GAUZE/BANDAGES/DRESSINGS) ×1 IMPLANT
BINDER BREAST MEDIUM (GAUZE/BANDAGES/DRESSINGS) IMPLANT
BINDER BREAST XLRG (GAUZE/BANDAGES/DRESSINGS) IMPLANT
BINDER BREAST XXLRG (GAUZE/BANDAGES/DRESSINGS) IMPLANT
BLADE SURG 15 STRL LF DISP TIS (BLADE) ×1 IMPLANT
BLADE SURG 15 STRL SS (BLADE) ×2
CANISTER SUC SOCK COL 7IN (MISCELLANEOUS) IMPLANT
CANISTER SUCT 1200ML W/VALVE (MISCELLANEOUS) IMPLANT
CHLORAPREP W/TINT 26 (MISCELLANEOUS) ×2 IMPLANT
CLIP APPLIE 9.375 MED OPEN (MISCELLANEOUS) IMPLANT
CLIP TI WIDE RED SMALL 6 (CLIP) IMPLANT
COVER BACK TABLE 60X90IN (DRAPES) ×2 IMPLANT
COVER MAYO STAND STRL (DRAPES) ×2 IMPLANT
COVER PROBE W GEL 5X96 (DRAPES) ×2 IMPLANT
DERMABOND ADVANCED (GAUZE/BANDAGES/DRESSINGS) ×1
DERMABOND ADVANCED .7 DNX12 (GAUZE/BANDAGES/DRESSINGS) ×1 IMPLANT
DRAPE LAPAROSCOPIC ABDOMINAL (DRAPES) ×2 IMPLANT
DRAPE UTILITY XL STRL (DRAPES) ×2 IMPLANT
DRSG TEGADERM 4X4.75 (GAUZE/BANDAGES/DRESSINGS) IMPLANT
ELECT COATED BLADE 2.86 ST (ELECTRODE) ×2 IMPLANT
ELECT REM PT RETURN 9FT ADLT (ELECTROSURGICAL) ×2
ELECTRODE REM PT RTRN 9FT ADLT (ELECTROSURGICAL) ×1 IMPLANT
GAUZE SPONGE 4X4 12PLY STRL LF (GAUZE/BANDAGES/DRESSINGS) IMPLANT
GLOVE BIO SURGEON STRL SZ7 (GLOVE) ×4 IMPLANT
GLOVE BIOGEL PI IND STRL 7.5 (GLOVE) ×1 IMPLANT
GLOVE BIOGEL PI INDICATOR 7.5 (GLOVE) ×1
GOWN STRL REUS W/ TWL LRG LVL3 (GOWN DISPOSABLE) ×2 IMPLANT
GOWN STRL REUS W/TWL LRG LVL3 (GOWN DISPOSABLE) ×4
HEMOSTAT ARISTA ABSORB 3G PWDR (HEMOSTASIS) IMPLANT
KIT MARKER MARGIN INK (KITS) ×2 IMPLANT
NDL HYPO 25X1 1.5 SAFETY (NEEDLE) ×1 IMPLANT
NEEDLE HYPO 25X1 1.5 SAFETY (NEEDLE) ×2 IMPLANT
NS IRRIG 1000ML POUR BTL (IV SOLUTION) IMPLANT
PACK BASIN DAY SURGERY FS (CUSTOM PROCEDURE TRAY) ×2 IMPLANT
PENCIL SMOKE EVACUATOR (MISCELLANEOUS) ×2 IMPLANT
RETRACTOR ONETRAX LX 90X20 (MISCELLANEOUS) IMPLANT
SLEEVE SCD COMPRESS KNEE MED (STOCKING) ×2 IMPLANT
SPIKE FLUID TRANSFER (MISCELLANEOUS) IMPLANT
SPONGE T-LAP 4X18 ~~LOC~~+RFID (SPONGE) ×2 IMPLANT
STRIP CLOSURE SKIN 1/2X4 (GAUZE/BANDAGES/DRESSINGS) ×2 IMPLANT
SUT MNCRL AB 4-0 PS2 18 (SUTURE) ×2 IMPLANT
SUT MON AB 5-0 PS2 18 (SUTURE) IMPLANT
SUT SILK 2 0 SH (SUTURE) ×1 IMPLANT
SUT VIC AB 2-0 SH 27 (SUTURE) ×4
SUT VIC AB 2-0 SH 27XBRD (SUTURE) ×1 IMPLANT
SUT VIC AB 3-0 SH 27 (SUTURE) ×2
SUT VIC AB 3-0 SH 27X BRD (SUTURE) ×1 IMPLANT
SUT VIC AB 5-0 PS2 18 (SUTURE) IMPLANT
SYR CONTROL 10ML LL (SYRINGE) ×2 IMPLANT
TOWEL GREEN STERILE FF (TOWEL DISPOSABLE) ×2 IMPLANT
TRAY FAXITRON CT DISP (TRAY / TRAY PROCEDURE) ×2 IMPLANT
TUBE CONNECTING 20X1/4 (TUBING) IMPLANT
YANKAUER SUCT BULB TIP NO VENT (SUCTIONS) IMPLANT

## 2022-04-10 NOTE — Anesthesia Procedure Notes (Signed)
Procedure Name: LMA Insertion Date/Time: 04/10/2022 12:22 PM  Performed by: Tawni Millers, CRNAPre-anesthesia Checklist: Patient identified, Emergency Drugs available, Suction available and Patient being monitored Patient Re-evaluated:Patient Re-evaluated prior to induction Oxygen Delivery Method: Circle system utilized Preoxygenation: Pre-oxygenation with 100% oxygen Induction Type: IV induction Ventilation: Mask ventilation without difficulty LMA: LMA inserted LMA Size: 3.0 Number of attempts: 1 Airway Equipment and Method: Bite block Placement Confirmation: positive ETCO2 Tube secured with: Tape Dental Injury: Teeth and Oropharynx as per pre-operative assessment

## 2022-04-10 NOTE — H&P (Signed)
35 yof with d density breasts who had screening mammogram that has a left loq area of segmental calcifications. She has no family history. There is a 4 cm area of calcs. This was biopsied centrally. 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 She is here with her husband to discuss options  Review of Systems: A complete review of systems was obtained from the patient. I have reviewed this information and discussed as appropriate with the patient. See HPI as well for other ROS.  Review of Systems  Musculoskeletal: Positive for joint pain.  All other systems reviewed and are negative.   Medical History: Past Medical History:  Diagnosis Date  Arthritis   Patient Active Problem List  Diagnosis  Seronegative rheumatoid arthritis (CMS-HCC)  Primary osteoarthritis  Primary localized osteoarthrosis of multiple sites  Myalgia  Ductal carcinoma in situ (DCIS) of left breast  Inflammatory polyarthropathy (CMS-HCC)  Fatigue  Other specified abnormal immunological findings in serum  Easy bruising  Bleeds easily (CMS-HCC)   Past Surgical History:  Procedure Laterality Date  LAPAROSCOPIC TUBAL LIGATION N/A 2000  foot sx N/A 08/2020   No Known Allergies  Current Outpatient Medications on File Prior to Visit  Medication Sig Dispense Refill  meloxicam (MOBIC) 15 MG tablet Take 15 mg by mouth as needed for Pain   Family History  Problem Relation Age of Onset  High blood pressure (Hypertension) Mother  Hyperlipidemia (Elevated cholesterol) Mother  Colon cancer Father  Coronary Artery Disease (Blocked arteries around heart) Maternal Grandfather    Social History   Tobacco Use  Smoking Status Never  Smokeless Tobacco Never   Socioeconomic History  Marital status: Married  Tobacco Use  Smoking status: Never  Smokeless tobacco: Never  Vaping Use  Vaping Use: Never used  Substance and Sexual Activity  Alcohol use: Never  Drug use: Never  Sexual activity: Defer    Objective:  Physical Exam Vitals reviewed.  Constitutional:  Appearance: Normal appearance.  Chest:  Breasts: Right: No inverted nipple, mass or nipple discharge.  Left: No inverted nipple, mass or nipple discharge.  Lymphadenopathy:  Upper Body:  Right upper body: No supraclavicular or axillary adenopathy.  Left upper body: No supraclavicular or axillary adenopathy.  Neurological:  Mental Status: She is alert.    Assessment and Plan:  Left breast DCIS Left breast seed bracketed lumpectomy  We discussed the staging and pathophysiology of breast cancer. We discussed all of the different options for treatment for breast cancer including surgery, chemotherapy, radiation therapy, Herceptin, and antiestrogen therapy. She has genetic testing pending so will await that testing.  We discussed a sentinel lymph node biopsy and she does not need this now due to dcis.  We discussed the options for treatment of the breast cancer which included lumpectomy versus a mastectomy. We discussed the performance of the lumpectomy with radioactive seed placement. We discussed a 5-10% chance of a positive margin requiring reexcision in the operating room. We also discussed that she will likely be recommended radiation therapy if she undergoes lumpectomy. We discussed mastectomy and the postoperative care for that as well. Mastectomy can be followed by reconstruction. The decision for lumpectomy vs mastectomy has no impact on decision for chemotherapy. Most mastectomy patients will not need radiation therapy. We discussed that there is no difference in her survival whether she undergoes lumpectomy with radiation therapy or antiestrogen therapy versus a mastectomy. There is also no real difference between her recurrence in the breast. We discussed  the risks of operation including bleeding, infection, possible reoperation. She understands her further therapy will be based on what her stages at the time of her  operation.

## 2022-04-10 NOTE — Transfer of Care (Signed)
Immediate Anesthesia Transfer of Care Note  Patient: Jaime Howard  Procedure(s) Performed: RADIOACTIVE SEED BRACKETED LEFT BREAST LUMPECTOMY (Left: Breast)  Patient Location: PACU  Anesthesia Type:General  Level of Consciousness: sedated  Airway & Oxygen Therapy: Patient Spontanous Breathing and Patient connected to face mask oxygen  Post-op Assessment: Report given to RN and Post -op Vital signs reviewed and stable  Post vital signs: Reviewed and stable  Last Vitals:  Vitals Value Taken Time  BP 101/62 04/10/22 1321  Temp    Pulse 68 04/10/22 1322  Resp 17 04/10/22 1322  SpO2 100 % 04/10/22 1322  Vitals shown include unvalidated device data.  Last Pain:  Vitals:   04/10/22 1055  TempSrc: Oral  PainSc: 0-No pain      Patients Stated Pain Goal: 3 (90/37/95 5831)  Complications: No notable events documented.

## 2022-04-10 NOTE — Op Note (Signed)
Preoperative diagnosis: Left breast ductal carcinoma in situ Postoperative diagnosis: Same as above Procedure: Left breast radioactive seed bracketed lumpectomy Surgeon: Dr. Serita Grammes Anesthesia: General Estimated blood loss: Minimal Specimens: 1.  Left breast tissue containing 2 seeds and a clip 2.  Additional medial and lateral margins marked short superior, long lateral, double deep Complications: None Drains: None Sponge needle count was correct at completion Disposition recovery stable condition  Indications:45 yof with d density breasts who had screening mammogram that has a left loq area of segmental calcifications. She has no family history. There is a 4 cm area of calcs. This was biopsied centrally. 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 we discussed a bracketed lumpectomy.  Procedure: Patient first had 2 seeds placed at the imaging center.  I had these mammograms available for my review.  After informed consent was obtained she was taken to the operating room.  She was given antibiotics.  SCDs were placed.  She was placed under general anesthesia without complication.  She was prepped and draped in the standard sterile surgical fashion.  Surgical timeout was then performed.  I elected to make a curvilinear incision between the 2 seeds.  The lateral margin of the calcifications was the skin so I wanted to ensure my anterior margin was clear.  I then used the neoprobe probe to guide the dissection to both seeds.  I identified both seeds and then remove the tissue lateral to the.  This was then marked with paint.  I did a 3D image and thought I might be close to my medial and lateral margin so I removed some more.  I then obtained hemostasis.  I closed the breast tissue with 2-0 Vicryl.  The skin was closed with 3-0 Vicryl for Monocryl.  Glue and Steri-Strips were applied.  She tolerated this well was extubated transferred recovery stable.

## 2022-04-10 NOTE — Interval H&P Note (Signed)
History and Physical Interval Note:  04/10/2022 11:50 AM  Jaime Howard  has presented today for surgery, with the diagnosis of LEFT BREAST DUCTAL CARCINOMA IN SITU.  The various methods of treatment have been discussed with the patient and family. After consideration of risks, benefits and other options for treatment, the patient has consented to  Procedure(s): LEFT BREAST SEED BRACKETED LUMPECTOMY (Left) as a surgical intervention.  The patient's history has been reviewed, patient examined, no change in status, stable for surgery.  I have reviewed the patient's chart and labs.  Questions were answered to the patient's satisfaction.     Rolm Bookbinder

## 2022-04-10 NOTE — Discharge Instructions (Addendum)
Post Anesthesia Home Care Instructions  Activity: Get plenty of rest for the remainder of the day. A responsible individual must stay with you for 24 hours following the procedure.  For the next 24 hours, DO NOT: -Drive a car -Paediatric nurse -Drink alcoholic beverages -Take any medication unless instructed by your physician -Make any legal decisions or sign important papers.  Meals: Start with liquid foods such as gelatin or soup. Progress to regular foods as tolerated. Avoid greasy, spicy, heavy foods. If nausea and/or vomiting occur, drink only clear liquids until the nausea and/or vomiting subsides. Call your physician if vomiting continues.  Special Instructions/Symptoms: Your throat may feel dry or sore from the anesthesia or the breathing tube placed in your throat during surgery. If this causes discomfort, gargle with warm salt water. The discomfort should disappear within 24 hours.  If you had a scopolamine patch placed behind your ear for the management of post- operative nausea and/or vomiting:  1. The medication in the patch is effective for 72 hours, after which it should be removed.  Wrap patch in a tissue and discard in the trash. Wash hands thoroughly with soap and water. 2. You may remove the patch earlier than 72 hours if you experience unpleasant side effects which may include dry mouth, dizziness or visual disturbances. 3. Avoid touching the patch. Wash your hands with soap and water after contact with the patch.    [place] Office Phone Number [phone]  POST OP INSTRUCTIONS Take 400 mg of ibuprofen every 8 hours or 650 mg tylenol every 6 hours for next 72 hours then as needed. Use ice several times daily also.   A prescription for pain medication may be given to you upon discharge.  Take your pain medication as prescribed, if needed.  If narcotic pain medicine is not needed, then you may take acetaminophen (Tylenol), naprosyn (Alleve) or  ibuprofen (Advil) as needed. Take your usually prescribed medications unless otherwise directed If you need a refill on your pain medication, please contact your pharmacy.  They will contact our office to request authorization.  Prescriptions will not be filled after 5pm or on week-ends. You should eat very light the first 24 hours after surgery, such as soup, crackers, pudding, etc.  Resume your normal diet the day after surgery. Most patients will experience some swelling and bruising in the breast.  Ice packs and a good support bra will help.  Wear the breast binder provided or a sports bra for 72 hours day and night.  After that wear a sports bra during the day until you return to the office. Swelling and bruising can take several days to resolve.  It is common to experience some constipation if taking pain medication after surgery.  Increasing fluid intake and taking a stool softener will usually help or prevent this problem from occurring.  A mild laxative (Milk of Magnesia or Miralax) should be taken according to package directions if there are no bowel movements after 48 hours. I used skin glue on the incision, you may shower in 24 hours.  The glue will flake off over the next 2-3 weeks.  Any sutures or staples will be removed at the office during your follow-up visit. ACTIVITIES:  You may resume regular daily activities (gradually increasing) beginning the next day.  Wearing a good support bra or sports bra minimizes pain and swelling.  You may have sexual intercourse when it is comfortable. You may drive when you no longer are taking  prescription pain medication, you can comfortably wear a seatbelt, and you can safely maneuver your car and apply brakes. RETURN TO WORK:  ______________________________________________________________________________________ Dennis Bast should see your doctor in the office for a follow-up appointment approximately two weeks after your surgery.  Your doctor's nurse will  typically make your follow-up appointment when she calls you with your pathology report.  Expect your pathology report 3-4 business days after your surgery.  You may call to check if you do not hear from [place] after three days. OTHER INSTRUCTIONS: _______________________________________________________________________________________________ _____________________________________________________________________________________________________________________________________ _____________________________________________________________________________________________________________________________________ _____________________________________________________________________________________________________________________________________  WHEN TO CALL DR WAKEFIELD: Fever over 101.0 Nausea and/or vomiting. Extreme swelling or bruising. Continued bleeding from incision. Increased pain, redness, or drainage from the incision.  The clinic staff is available to answer your questions during regular business hours.  Please don't hesitate to call and ask to speak to one of the nurses for clinical concerns.  If you have a medical emergency, go to the nearest emergency room or call 911.  A surgeon from [facility] is always on call at the hospital.  For further questions, please visit [email or web address]  May take Tylenol after 5pm, if needed. May take NSAIDS (ibuprofen, motrin) after 5pm, if needed. \   Post Anesthesia Home Care Instructions  Activity: Get plenty of rest for the remainder of the day. A responsible individual must stay with you for 24 hours following the procedure.  For the next 24 hours, DO NOT: -Drive a car -Paediatric nurse -Drink alcoholic beverages -Take any medication unless instructed by your physician -Make any legal decisions or sign important papers.  Meals: Start with liquid foods such as gelatin or soup. Progress to regular foods as tolerated.  Avoid greasy, spicy, heavy foods. If nausea and/or vomiting occur, drink only clear liquids until the nausea and/or vomiting subsides. Call your physician if vomiting continues.  Special Instructions/Symptoms: Your throat may feel dry or sore from the anesthesia or the breathing tube placed in your throat during surgery. If this causes discomfort, gargle with warm salt water. The discomfort should disappear within 24 hours.  If you had a scopolamine patch placed behind your ear for the management of post- operative nausea and/or vomiting:  1. The medication in the patch is effective for 72 hours, after which it should be removed.  Wrap patch in a tissue and discard in the trash. Wash hands thoroughly with soap and water. 2. You may remove the patch earlier than 72 hours if you experience unpleasant side effects which may include dry mouth, dizziness or visual disturbances. 3. Avoid touching the patch. Wash your hands with soap and water after contact with the patch.

## 2022-04-10 NOTE — Anesthesia Preprocedure Evaluation (Addendum)
Anesthesia Evaluation  Patient identified by MRN, date of birth, ID band Patient awake    Reviewed: Allergy & Precautions, NPO status , Patient's Chart, lab work & pertinent test results  Airway Mallampati: III  TM Distance: >3 FB Neck ROM: Full    Dental  (+) Teeth Intact, Dental Advisory Given   Pulmonary neg pulmonary ROS,    breath sounds clear to auscultation       Cardiovascular negative cardio ROS   Rhythm:Regular     Neuro/Psych negative neurological ROS  negative psych ROS   GI/Hepatic negative GI ROS, Neg liver ROS,   Endo/Other  negative endocrine ROS  Renal/GU negative Renal ROS     Musculoskeletal  (+) Arthritis , Rheumatoid disorders,    Abdominal   Peds  Hematology negative hematology ROS (+)   Anesthesia Other Findings LEFT BREAST DUCTAL CARCINOMA IN SITU  Reproductive/Obstetrics Lab Results      Component                Value               Date                      PREGTESTUR               NEGATIVE            04/10/2022                                        Anesthesia Physical Anesthesia Plan  ASA: 2  Anesthesia Plan: General   Post-op Pain Management: Tylenol PO (pre-op)*   Induction:   PONV Risk Score and Plan: 3 and Ondansetron, Dexamethasone, Propofol infusion, TIVA and Midazolam  Airway Management Planned: LMA  Additional Equipment: None  Intra-op Plan:   Post-operative Plan: Extubation in OR  Informed Consent: I have reviewed the patients History and Physical, chart, labs and discussed the procedure including the risks, benefits and alternatives for the proposed anesthesia with the patient or authorized representative who has indicated his/her understanding and acceptance.     Dental advisory given  Plan Discussed with: CRNA  Anesthesia Plan Comments:        Anesthesia Quick Evaluation

## 2022-04-10 NOTE — Anesthesia Postprocedure Evaluation (Signed)
Anesthesia Post Note  Patient: Jaime Howard  Procedure(s) Performed: RADIOACTIVE SEED BRACKETED LEFT BREAST LUMPECTOMY (Left: Breast)     Patient location during evaluation: PACU Anesthesia Type: General Level of consciousness: awake and alert Pain management: pain level controlled Vital Signs Assessment: post-procedure vital signs reviewed and stable Respiratory status: spontaneous breathing, nonlabored ventilation, respiratory function stable and patient connected to nasal cannula oxygen Cardiovascular status: blood pressure returned to baseline and stable Postop Assessment: no apparent nausea or vomiting Anesthetic complications: no   No notable events documented.  Last Vitals:  Vitals:   04/10/22 1415 04/10/22 1430  BP: 106/65 129/73  Pulse: 71 (!) 58  Resp: 18 16  Temp:  (!) 36.2 C  SpO2: 99% 100%    Last Pain:  Vitals:   04/10/22 1415  TempSrc:   PainSc: 0-No pain                 Admir Candelas

## 2022-04-11 ENCOUNTER — Encounter (HOSPITAL_BASED_OUTPATIENT_CLINIC_OR_DEPARTMENT_OTHER): Payer: Self-pay | Admitting: General Surgery

## 2022-04-11 ENCOUNTER — Encounter: Payer: Self-pay | Admitting: Genetic Counselor

## 2022-04-11 NOTE — Addendum Note (Signed)
Addendum  created 04/11/22 1534 by Maryella Shivers, CRNA   Charge Capture section accepted

## 2022-04-12 LAB — SURGICAL PATHOLOGY

## 2022-04-17 ENCOUNTER — Encounter: Payer: Self-pay | Admitting: *Deleted

## 2022-04-18 ENCOUNTER — Encounter (HOSPITAL_COMMUNITY): Payer: Self-pay

## 2022-05-02 ENCOUNTER — Inpatient Hospital Stay: Payer: Managed Care, Other (non HMO) | Admitting: Hematology

## 2022-05-02 ENCOUNTER — Encounter: Payer: Self-pay | Admitting: *Deleted

## 2022-05-07 ENCOUNTER — Encounter: Payer: Self-pay | Admitting: *Deleted

## 2022-05-07 ENCOUNTER — Other Ambulatory Visit: Payer: Self-pay

## 2022-05-07 ENCOUNTER — Inpatient Hospital Stay: Payer: Managed Care, Other (non HMO) | Attending: Hematology | Admitting: Hematology

## 2022-05-07 ENCOUNTER — Encounter: Payer: Self-pay | Admitting: Hematology

## 2022-05-07 VITALS — BP 127/79 | HR 68 | Temp 97.9°F | Resp 18 | Ht 65.0 in | Wt 143.1 lb

## 2022-05-07 DIAGNOSIS — Z170 Estrogen receptor positive status [ER+]: Secondary | ICD-10-CM | POA: Insufficient documentation

## 2022-05-07 DIAGNOSIS — Z79810 Long term (current) use of selective estrogen receptor modulators (SERMs): Secondary | ICD-10-CM | POA: Diagnosis not present

## 2022-05-07 DIAGNOSIS — M069 Rheumatoid arthritis, unspecified: Secondary | ICD-10-CM | POA: Diagnosis not present

## 2022-05-07 DIAGNOSIS — D0512 Intraductal carcinoma in situ of left breast: Secondary | ICD-10-CM | POA: Insufficient documentation

## 2022-05-07 MED ORDER — TAMOXIFEN CITRATE 10 MG PO TABS: 10.0000 mg | ORAL_TABLET | Freq: Every day | ORAL | 3 refills | Status: DC

## 2022-05-07 NOTE — Progress Notes (Signed)
[place]   Telephone:([phone] Fax:(336) [phone]   Clinic Follow up Note   Patient Care Team: Emelda Fear, DO as PCP - General (Family Medicine) Rockwell Germany, RN as Oncology Nurse Navigator Mauro Kaufmann, RN as Oncology Nurse Navigator Rolm Bookbinder, MD as Consulting Physician (General Surgery) Truitt Merle, MD as Consulting Physician (Hematology) Gery Pray, MD as Consulting Physician (Radiation Oncology)  Date of Service:  05/07/2022  CHIEF COMPLAINT: f/u of left breast DCIS  CURRENT THERAPY:  To start tamoxifen 10 mg, 05/07/22  ASSESSMENT & PLAN:  Jaime Howard is a 46 y.o. pre-menopausal female with   1. Left breast DCIS, grade 2-3, ER+/PR+ -found on screening mammogram. S/p left lumpectomy 04/10/22 under Dr. Donne Hazel, path showed patchy intermediate grade DCIS with apocrine features, margins negative. -Her DCIS was cured by complete surgical resection. Any form of adjuvant therapy is preventive. She would prefer to avoid radiation therapy. -Patient declined adjuvant radiation. -Given her strongly positive ER and PR and premenopausal status, I recommend antiestrogen therapy with tamoxifen, which decrease her risk of future breast cancer by ~40%.  Given the recent clinical data, I recommend a low-dose 10 mg daily for 5 years --The potential side effects, which includes but not limited to, hot flash, skin and vaginal dryness, slightly increased risk of cardiovascular disease and cataract, small risk of thrombosis and endometrial cancer, were discussed with her in great details. Preventive strategies for thrombosis, such as being physically active, using compression stocks, avoid cigarette smoking, etc., were reviewed with her.  I discussed the risk of DVT is low in low-dose tamoxifen.  I also recommend her to follow-up with her gynecologist once a year, and watch for abnormal vaginal spotting or bleeding, as a clinical sign of endometrial cancer,  etc. She voiced good understanding, and is interested -We discussed breast cancer surveillance after she completes treatment, Including annual mammogram, breast exam every 6-12 months. We also discussed the use of screening breast MRI as an additional tool to detect cancer; we will plan to start this next year.     PLAN:  -start tamoxifen 10 mg -Virtual survivorship in 3 months -She will follow-up with her breast surgeon next month -lab and f/u in 6 months   No problem-specific Assessment & Plan notes found for this encounter.   SUMMARY OF ONCOLOGIC HISTORY: Oncology History  Ductal carcinoma in situ (DCIS) of left breast  03/06/2022 Mammogram   Exam: 3D Mammogram Diagnostic - Left  IMPRESSION: The 4 cm segmental pleomorphic calcifications in the left breast at 3-4 o'clock are suspicious of malignancy.   03/18/2022 Initial Biopsy   Diagnosis 1. Breast, left, needle core biopsy, calcification 3-4:00 middle depth  - DUCTAL CARCINOMA IN SITU (DCIS), APOCRINE TYPE, NUCLEAR GRADE I-II, WITH CALCIFICATIONS AND CENTRAL NECROSIS  1. PROGNOSTIC INDICATORS Results: Estrogen Receptor: 90%, POSITIVE, STRONG STAINING INTENSITY Progesterone Receptor: 50%, POSITIVE, STRONG STAINING INTENSITY   03/21/2022 Initial Diagnosis   Ductal carcinoma in situ (DCIS) of left breast    Genetic Testing   [facility] CancerNext-Expanded Panel was Negative. Report date is 04/06/2022.  The CancerNext-Expanded gene panel offered by [facility] and includes sequencing, rearrangement, and RNA analysis for the following 77 genes: AIP, ALK, APC, ATM, AXIN2, BAP1, BARD1, BLM, BMPR1A, BRCA1, BRCA2, BRIP1, CDC73, CDH1, CDK4, CDKN1B, CDKN2A, CHEK2, CTNNA1, DICER1, FANCC, FH, FLCN, GALNT12, KIF1B, LZTR1, MAX, MEN1, MET, MLH1, MSH2, MSH3, MSH6, MUTYH, NBN, NF1, NF2, NTHL1, PALB2, PHOX2B, PMS2, POT1, PRKAR1A, PTCH1, PTEN, RAD51C, RAD51D, RB1, RECQL, RET, SDHA,  SDHAF2, SDHB, SDHC, SDHD, SMAD4, SMARCA4, SMARCB1, SMARCE1, STK11,  SUFU, TMEM127, TP53, TSC1, TSC2, VHL and XRCC2 (sequencing and deletion/duplication); EGFR, EGLN1, HOXB13, KIT, MITF, PDGFRA, POLD1, and POLE (sequencing only); EPCAM and GREM1 (deletion/duplication only).    04/10/2022 Cancer Staging   Staging form: Breast, AJCC 8th Edition - Pathologic stage from 04/10/2022: Stage Unknown (pTis (DCIS), pNX, G2, ER+, PR+, HER2: Not Assessed) - Signed by Truitt Merle, MD on 05/06/2022 Stage prefix: Initial diagnosis Histologic grading system: 3 grade system Residual tumor (R): R0 - None      INTERVAL HISTORY:  Jaime Howard is here for a follow up of DCIS. She was last seen by me on 03/27/22 in the breast clinic. She presents to the clinic alone. She reports she still has some soreness to the incision.   All other systems were reviewed with the patient and are negative.  MEDICAL HISTORY:  Past Medical History:  Diagnosis Date   Breast cancer ([place])    Rheumatoid arthritis ([place])     SURGICAL HISTORY: Past Surgical History:  Procedure Laterality Date   BREAST LUMPECTOMY WITH RADIOACTIVE SEED LOCALIZATION Left 04/10/2022   Procedure: RADIOACTIVE SEED BRACKETED LEFT BREAST LUMPECTOMY;  Surgeon: Rolm Bookbinder, MD;  Location: [facility];  Service: General;  Laterality: Left;   FOOT SURGERY Left    TUBAL LIGATION  11/1998    I have reviewed the social history and family history with the patient and they are unchanged from previous note.  ALLERGIES:  has No Known Allergies.  MEDICATIONS:  Current Outpatient Medications  Medication Sig Dispense Refill   tamoxifen (NOLVADEX) 10 MG tablet Take 1 tablet (10 mg total) by mouth daily. 30 tablet 3   acetaminophen (TYLENOL) 325 MG tablet Take 650 mg by mouth every 6 (six) hours as needed for mild pain or headache.     meloxicam (MOBIC) 15 MG tablet Take 15 mg by mouth as needed for pain.     traMADol (ULTRAM) 50 MG tablet Take 1 tablet (50 mg total) by mouth every 6 (six) hours as needed. 10  tablet 0   No current facility-administered medications for this visit.    PHYSICAL EXAMINATION: ECOG PERFORMANCE STATUS: 0 - Asymptomatic  Vitals:   05/07/22 0910  BP: 127/79  Pulse: 68  Resp: 18  Temp: 97.9 F (36.6 C)  SpO2: 100%   Wt Readings from Last 3 Encounters:  05/07/22 143 lb 1.6 oz (64.9 kg)  04/10/22 141 lb 1.5 oz (64 kg)  03/27/22 142 lb 1.6 oz (64.5 kg)     GENERAL:alert, no distress and comfortable SKIN: skin color, texture, turgor are normal, no rashes or significant lesions EYES: normal, Conjunctiva are pink and non-injected, sclera clear  Musculoskeletal:no cyanosis of digits and no clubbing  NEURO: alert & oriented x 3 with fluent speech, no focal motor/sensory deficits BREAST: Status post left lumpectomy, incision healing well with scar tissue  LABORATORY DATA:  I have reviewed the data as listed    Latest Ref Rng & Units 03/27/2022   12:10 PM  CBC  WBC 4.0 - 10.5 K/uL 7.0   Hemoglobin 12.0 - 15.0 g/dL 15.1   Hematocrit 36.0 - 46.0 % 44.9   Platelets 150 - 400 K/uL 182         Latest Ref Rng & Units 03/27/2022   12:10 PM  CMP  Glucose 70 - 99 mg/dL 75   BUN 6 - 20 mg/dL 12   Creatinine 0.44 - 1.00 mg/dL 0.88  Sodium 135 - 145 mmol/L 138   Potassium 3.5 - 5.1 mmol/L 3.6   Chloride 98 - 111 mmol/L 104   CO2 22 - 32 mmol/L 27   Calcium 8.9 - 10.3 mg/dL 9.7   Total Protein 6.5 - 8.1 g/dL 7.9   Total Bilirubin 0.3 - 1.2 mg/dL 0.5   Alkaline Phos 38 - 126 U/L 58   AST 15 - 41 U/L 14   ALT 0 - 44 U/L 7       RADIOGRAPHIC STUDIES: I have personally reviewed the radiological images as listed and agreed with the findings in the report. No results found.    No orders of the defined types were placed in this encounter.  All questions were answered. The patient knows to call the clinic with any problems, questions or concerns. No barriers to learning was detected. The total time spent in the appointment was 30 minutes.     Truitt Merle,  MD 05/07/2022   I, Wilburn Mylar, am acting as scribe for Truitt Merle, MD.   I have reviewed the above documentation for accuracy and completeness, and I agree with the above.

## 2022-05-16 ENCOUNTER — Other Ambulatory Visit: Payer: Self-pay | Admitting: Hematology

## 2022-05-16 ENCOUNTER — Encounter: Payer: Self-pay | Admitting: Hematology

## 2022-05-16 ENCOUNTER — Telehealth: Payer: Self-pay

## 2022-05-16 MED ORDER — ONDANSETRON 4 MG PO TBDP: 4.0000 mg | ORAL_TABLET | Freq: Three times a day (TID) | ORAL | 0 refills | Status: AC | PRN

## 2022-05-16 NOTE — Telephone Encounter (Signed)
T/C from pt stating since she started the tamoxifen she has been having a lot of nausea.  She is asking if we can send in a sublingual nausea medication.  She is going to be traveling and would likea prescription sent in today if possible.

## 2022-08-05 ENCOUNTER — Other Ambulatory Visit: Payer: Self-pay | Admitting: Hematology

## 2022-08-08 ENCOUNTER — Encounter: Payer: Managed Care, Other (non HMO) | Admitting: Adult Health

## 2022-08-08 NOTE — Progress Notes (Deleted)
No show for appt. 

## 2022-11-06 ENCOUNTER — Other Ambulatory Visit: Payer: Self-pay

## 2022-11-06 DIAGNOSIS — D0512 Intraductal carcinoma in situ of left breast: Secondary | ICD-10-CM

## 2022-11-06 NOTE — Assessment & Plan Note (Deleted)
grade 2-3, ER+/PR+ -found on screening mammogram. S/p left lumpectomy 04/10/22 under Dr. Donne Hazel, path showed patchy intermediate grade DCIS with apocrine features, margins negative. -Her DCIS was cured by complete surgical resection. Any form of adjuvant therapy is preventive. She would prefer to avoid radiation therapy. -Patient declined adjuvant radiation -started adjuvant tamoxifen '10mg'$  daily in 04/2022

## 2022-11-07 ENCOUNTER — Inpatient Hospital Stay: Payer: Self-pay | Attending: Hematology | Admitting: Hematology

## 2022-11-07 ENCOUNTER — Inpatient Hospital Stay: Payer: Self-pay

## 2022-11-07 DIAGNOSIS — D0512 Intraductal carcinoma in situ of left breast: Secondary | ICD-10-CM

## 2022-11-07 NOTE — Progress Notes (Deleted)
[place]   Telephone:(336) [phone] Fax:(336) [phone]   Clinic Follow up Note   Patient Care Team: Emelda Fear, DO as PCP - General (Family Medicine) Rolm Bookbinder, MD as Consulting Physician (General Surgery) Truitt Merle, MD as Consulting Physician (Hematology) Gery Pray, MD as Consulting Physician (Radiation Oncology)  Date of Service:  11/07/2022  CHIEF COMPLAINT: f/u of left breast DCIS   CURRENT THERAPY:  To start tamoxifen 10 mg, 05/07/22    ASSESSMENT: *** Jaime Howard is a 47 y.o. female with   No problem-specific Assessment & Plan notes found for this encounter.  ***   PLAN:   SUMMARY OF ONCOLOGIC HISTORY: Oncology History  Ductal carcinoma in situ (DCIS) of left breast  03/06/2022 Mammogram   Exam: 3D Mammogram Diagnostic - Left  IMPRESSION: The 4 cm segmental pleomorphic calcifications in the left breast at 3-4 o'clock are suspicious of malignancy.   03/18/2022 Initial Biopsy   Diagnosis 1. Breast, left, needle core biopsy, calcification 3-4:00 middle depth  - DUCTAL CARCINOMA IN SITU (DCIS), APOCRINE TYPE, NUCLEAR GRADE I-II, WITH CALCIFICATIONS AND CENTRAL NECROSIS  1. PROGNOSTIC INDICATORS Results: Estrogen Receptor: 90%, POSITIVE, STRONG STAINING INTENSITY Progesterone Receptor: 50%, POSITIVE, STRONG STAINING INTENSITY   03/21/2022 Initial Diagnosis   Ductal carcinoma in situ (DCIS) of left breast    Genetic Testing   [facility] CancerNext-Expanded Panel was Negative. Report date is 04/06/2022.  The CancerNext-Expanded gene panel offered by [facility] and includes sequencing, rearrangement, and RNA analysis for the following 77 genes: AIP, ALK, APC, ATM, AXIN2, BAP1, BARD1, BLM, BMPR1A, BRCA1, BRCA2, BRIP1, CDC73, CDH1, CDK4, CDKN1B, CDKN2A, CHEK2, CTNNA1, DICER1, FANCC, FH, FLCN, GALNT12, KIF1B, LZTR1, MAX, MEN1, MET, MLH1, MSH2, MSH3, MSH6, MUTYH, NBN, NF1, NF2, NTHL1, PALB2, PHOX2B, PMS2, POT1, PRKAR1A, PTCH1, PTEN,  RAD51C, RAD51D, RB1, RECQL, RET, SDHA, SDHAF2, SDHB, SDHC, SDHD, SMAD4, SMARCA4, SMARCB1, SMARCE1, STK11, SUFU, TMEM127, TP53, TSC1, TSC2, VHL and XRCC2 (sequencing and deletion/duplication); EGFR, EGLN1, HOXB13, KIT, MITF, PDGFRA, POLD1, and POLE (sequencing only); EPCAM and GREM1 (deletion/duplication only).    04/10/2022 Cancer Staging   Staging form: Breast, AJCC 8th Edition - Pathologic stage from 04/10/2022: pTis (DCIS), pN0, G2, ER+, PR+, HER2: Not Assessed - Signed by Gardenia Phlegm, NP on 08/08/2022 Stage prefix: Initial diagnosis Histologic grading system: 3 grade system Residual tumor (R): R0 - None   04/10/2022 Surgery   Left lumpectomy: patchy intermediate grade DCIS, margins negative, no invasive cancer noted.    05/07/2022 -  Anti-estrogen oral therapy   10 mg Tamoxifen      INTERVAL HISTORY: *** Jaime Howard is here for a follow up of  left breast DCIS She was last seen by me on 05/07/2022 She presents to the clinic       All other systems were reviewed with the patient and are negative.  MEDICAL HISTORY:  Past Medical History:  Diagnosis Date   Breast cancer ([place])    Rheumatoid arthritis ([place])     SURGICAL HISTORY: Past Surgical History:  Procedure Laterality Date   BREAST LUMPECTOMY WITH RADIOACTIVE SEED LOCALIZATION Left 04/10/2022   Procedure: RADIOACTIVE SEED BRACKETED LEFT BREAST LUMPECTOMY;  Surgeon: Rolm Bookbinder, MD;  Location: [facility];  Service: General;  Laterality: Left;   FOOT SURGERY Left    TUBAL LIGATION  11/1998    I have reviewed the social history and family history with the patient and they are unchanged from previous note.  ALLERGIES:  has No Known Allergies.  MEDICATIONS:  Current  Outpatient Medications  Medication Sig Dispense Refill   acetaminophen (TYLENOL) 325 MG tablet Take 650 mg by mouth every 6 (six) hours as needed for mild pain or headache.     meloxicam (MOBIC) 15 MG tablet Take 15 mg by  mouth as needed for pain.     ondansetron (ZOFRAN-ODT) 4 MG disintegrating tablet Take 1 tablet (4 mg total) by mouth every 8 (eight) hours as needed for nausea or vomiting. 20 tablet 0   tamoxifen (NOLVADEX) 10 MG tablet TAKE 1 TABLET BY MOUTH EVERY DAY 90 tablet 1   traMADol (ULTRAM) 50 MG tablet Take 1 tablet (50 mg total) by mouth every 6 (six) hours as needed. 10 tablet 0   No current facility-administered medications for this visit.    PHYSICAL EXAMINATION: ECOG PERFORMANCE STATUS: {CHL ONC ECOG [id number]}  There were no vitals filed for this visit. Wt Readings from Last 3 Encounters:  05/07/22 143 lb 1.6 oz (64.9 kg)  04/10/22 141 lb 1.5 oz (64 kg)  03/27/22 142 lb 1.6 oz (64.5 kg)    {Only keep what was examined. If exam not performed, can use .CEXAM } GENERAL:alert, no distress and comfortable SKIN: skin color, texture, turgor are normal, no rashes or significant lesions EYES: normal, Conjunctiva are pink and non-injected, sclera clear {OROPHARYNX:no exudate, no erythema and lips, buccal mucosa, and tongue normal}  NECK: supple, thyroid normal size, non-tender, without nodularity LYMPH:  no palpable lymphadenopathy in the cervical, axillary {or inguinal} LUNGS: clear to auscultation and percussion with normal breathing effort HEART: regular rate & rhythm and no murmurs and no lower extremity edema ABDOMEN:abdomen soft, non-tender and normal bowel sounds Musculoskeletal:no cyanosis of digits and no clubbing  NEURO: alert & oriented x 3 with fluent speech, no focal motor/sensory deficits  LABORATORY DATA:  I have reviewed the data as listed    Latest Ref Rng & Units 03/27/2022   12:10 PM  CBC  WBC 4.0 - 10.5 K/uL 7.0   Hemoglobin 12.0 - 15.0 g/dL 15.1   Hematocrit 36.0 - 46.0 % 44.9   Platelets 150 - 400 K/uL 182         Latest Ref Rng & Units 03/27/2022   12:10 PM  CMP  Glucose 70 - 99 mg/dL 75   BUN 6 - 20 mg/dL 12   Creatinine 0.44 - 1.00 mg/dL 0.88    Sodium 135 - 145 mmol/L 138   Potassium 3.5 - 5.1 mmol/L 3.6   Chloride 98 - 111 mmol/L 104   CO2 22 - 32 mmol/L 27   Calcium 8.9 - 10.3 mg/dL 9.7   Total Protein 6.5 - 8.1 g/dL 7.9   Total Bilirubin 0.3 - 1.2 mg/dL 0.5   Alkaline Phos 38 - 126 U/L 58   AST 15 - 41 U/L 14   ALT 0 - 44 U/L 7       RADIOGRAPHIC STUDIES: I have personally reviewed the radiological images as listed and agreed with the findings in the report. No results found.    No orders of the defined types were placed in this encounter.  All questions were answered. The patient knows to call the clinic with any problems, questions or concerns. No barriers to learning was detected. The total time spent in the appointment was {CHL ONC TIME VISIT - [id number].     Baldemar Friday, CMA 11/07/2022   I, Audry Riles, CMA, am acting as scribe for Truitt Merle, MD.   {Add scribe attestation statement}

## 2022-11-08 ENCOUNTER — Telehealth: Payer: Self-pay | Admitting: Hematology

## 2022-11-08 NOTE — Telephone Encounter (Signed)
Rescheduled per inbasket message, patient has been called and notified of upcoming appointments.

## 2022-11-14 ENCOUNTER — Telehealth: Payer: Self-pay | Admitting: Hematology

## 2022-11-14 NOTE — Telephone Encounter (Signed)
Contacted patient to scheduled appointments. Left message with appointment details and a call back number if patient had any questions or could not accommodate the time we provided.

## 2022-12-04 ENCOUNTER — Ambulatory Visit: Payer: Self-pay | Admitting: Hematology

## 2022-12-04 ENCOUNTER — Inpatient Hospital Stay: Payer: Self-pay | Admitting: Nurse Practitioner

## 2022-12-04 ENCOUNTER — Other Ambulatory Visit: Payer: Self-pay

## 2022-12-04 ENCOUNTER — Inpatient Hospital Stay: Payer: Self-pay

## 2022-12-22 NOTE — Progress Notes (Deleted)
Patient Care Team: Emelda Fear, DO as PCP - General (Family Medicine) Rolm Bookbinder, MD as Consulting Physician (General Surgery) Truitt Merle, MD as Consulting Physician (Hematology) Gery Pray, MD as Consulting Physician (Radiation Oncology)   CHIEF COMPLAINT: Follow up left breast DCIS   Oncology History  Ductal carcinoma in situ (DCIS) of left breast  03/06/2022 Mammogram   Exam: 3D Mammogram Diagnostic - Left  IMPRESSION: The 4 cm segmental pleomorphic calcifications in the left breast at 3-4 o'clock are suspicious of malignancy.   03/18/2022 Initial Biopsy   Diagnosis 1. Breast, left, needle core biopsy, calcification 3-4:00 middle depth  - DUCTAL CARCINOMA IN SITU (DCIS), APOCRINE TYPE, NUCLEAR GRADE I-II, WITH CALCIFICATIONS AND CENTRAL NECROSIS  1. PROGNOSTIC INDICATORS Results: Estrogen Receptor: 90%, POSITIVE, STRONG STAINING INTENSITY Progesterone Receptor: 50%, POSITIVE, STRONG STAINING INTENSITY   03/21/2022 Initial Diagnosis   Ductal carcinoma in situ (DCIS) of left breast    Genetic Testing   [facility] CancerNext-Expanded Panel was Negative. Report date is 04/06/2022.  The CancerNext-Expanded gene panel offered by [facility] and includes sequencing, rearrangement, and RNA analysis for the following 77 genes: AIP, ALK, APC, ATM, AXIN2, BAP1, BARD1, BLM, BMPR1A, BRCA1, BRCA2, BRIP1, CDC73, CDH1, CDK4, CDKN1B, CDKN2A, CHEK2, CTNNA1, DICER1, FANCC, FH, FLCN, GALNT12, KIF1B, LZTR1, MAX, MEN1, MET, MLH1, MSH2, MSH3, MSH6, MUTYH, NBN, NF1, NF2, NTHL1, PALB2, PHOX2B, PMS2, POT1, PRKAR1A, PTCH1, PTEN, RAD51C, RAD51D, RB1, RECQL, RET, SDHA, SDHAF2, SDHB, SDHC, SDHD, SMAD4, SMARCA4, SMARCB1, SMARCE1, STK11, SUFU, TMEM127, TP53, TSC1, TSC2, VHL and XRCC2 (sequencing and deletion/duplication); EGFR, EGLN1, HOXB13, KIT, MITF, PDGFRA, POLD1, and POLE (sequencing only); EPCAM and GREM1 (deletion/duplication only).    04/10/2022 Cancer Staging   Staging form: Breast, AJCC 8th  Edition - Pathologic stage from 04/10/2022: pTis (DCIS), pN0, G2, ER+, PR+, HER2: Not Assessed - Signed by Gardenia Phlegm, NP on 08/08/2022 Stage prefix: Initial diagnosis Histologic grading system: 3 grade system Residual tumor (R): R0 - None   04/10/2022 Surgery   Left lumpectomy: patchy intermediate grade DCIS, margins negative, no invasive cancer noted.    05/07/2022 -  Anti-estrogen oral therapy   10 mg Tamoxifen      CURRENT THERAPY: Tamoxifen 10 mg   INTERVAL HISTORY Ms. Leven returns for follow up as scheduled. Last seen by Dr. Burr Medico 05/07/22. She continues tamoxifen.   ROS   Past Medical History:  Diagnosis Date   Breast cancer ([place])    Rheumatoid arthritis ([place])      Past Surgical History:  Procedure Laterality Date   BREAST LUMPECTOMY WITH RADIOACTIVE SEED LOCALIZATION Left 04/10/2022   Procedure: RADIOACTIVE SEED BRACKETED LEFT BREAST LUMPECTOMY;  Surgeon: Rolm Bookbinder, MD;  Location: [facility];  Service: General;  Laterality: Left;   FOOT SURGERY Left    TUBAL LIGATION  11/1998     Outpatient Encounter Medications as of 12/23/2022  Medication Sig   acetaminophen (TYLENOL) 325 MG tablet Take 650 mg by mouth every 6 (six) hours as needed for mild pain or headache.   meloxicam (MOBIC) 15 MG tablet Take 15 mg by mouth as needed for pain.   ondansetron (ZOFRAN-ODT) 4 MG disintegrating tablet Take 1 tablet (4 mg total) by mouth every 8 (eight) hours as needed for nausea or vomiting.   tamoxifen (NOLVADEX) 10 MG tablet TAKE 1 TABLET BY MOUTH EVERY DAY   traMADol (ULTRAM) 50 MG tablet Take 1 tablet (50 mg total) by mouth every 6 (six) hours as needed.   No facility-administered encounter medications  on file as of 12/23/2022.     There were no vitals filed for this visit. There is no height or weight on file to calculate BMI.   PHYSICAL EXAM GENERAL:alert, no distress and comfortable SKIN: no rash  EYES: sclera clear NECK: without  mass LYMPH:  no palpable cervical or supraclavicular lymphadenopathy  LUNGS: clear with normal breathing effort HEART: regular rate & rhythm, no lower extremity edema ABDOMEN: abdomen soft, non-tender and normal bowel sounds NEURO: alert & oriented x 3 with fluent speech, no focal motor/sensory deficits Breast exam:  PAC without erythema    CBC    Component Value Date/Time   WBC 7.0 03/27/2022 1210   RBC 5.14 (H) 03/27/2022 1210   HGB 15.1 (H) 03/27/2022 1210   HCT 44.9 03/27/2022 1210   PLT 182 03/27/2022 1210   MCV 87.4 03/27/2022 1210   MCH 29.4 03/27/2022 1210   MCHC 33.6 03/27/2022 1210   RDW 12.5 03/27/2022 1210   LYMPHSABS 1.9 03/27/2022 1210   MONOABS 0.5 03/27/2022 1210   EOSABS 0.1 03/27/2022 1210   BASOSABS 0.0 03/27/2022 1210     CMP     Component Value Date/Time   NA 138 03/27/2022 1210   K 3.6 03/27/2022 1210   CL 104 03/27/2022 1210   CO2 27 03/27/2022 1210   GLUCOSE 75 03/27/2022 1210   BUN 12 03/27/2022 1210   CREATININE 0.88 03/27/2022 1210   CALCIUM 9.7 03/27/2022 1210   PROT 7.9 03/27/2022 1210   ALBUMIN 4.5 03/27/2022 1210   AST 14 (L) 03/27/2022 1210   ALT 7 03/27/2022 1210   ALKPHOS 58 03/27/2022 1210   BILITOT 0.5 03/27/2022 1210   GFRNONAA >60 03/27/2022 1210     ASSESSMENT & PLAN:  PLAN:  No orders of the defined types were placed in this encounter.     All questions were answered. The patient knows to call the clinic with any problems, questions or concerns. No barriers to learning were detected. I spent *** counseling the patient face to face. The total time spent in the appointment was *** and more than 50% was on counseling, review of test results, and coordination of care.   Cira Rue, NP-C '@DATE'$ @

## 2022-12-23 ENCOUNTER — Inpatient Hospital Stay: Payer: Self-pay | Attending: Hematology

## 2022-12-23 ENCOUNTER — Inpatient Hospital Stay: Payer: Self-pay | Admitting: Nurse Practitioner

## 2022-12-24 ENCOUNTER — Encounter: Payer: Self-pay | Admitting: Nurse Practitioner

## 2022-12-24 ENCOUNTER — Telehealth: Payer: Self-pay | Admitting: Family Medicine

## 2022-12-24 NOTE — Telephone Encounter (Signed)
Per 3/11 IB reached out to patient to schedule, left voicemail for patient to call back to confirm or cancel appt.

## 2023-01-22 ENCOUNTER — Inpatient Hospital Stay: Payer: Self-pay | Attending: Hematology

## 2023-01-22 ENCOUNTER — Inpatient Hospital Stay: Payer: Self-pay | Admitting: Hematology

## 2023-01-22 ENCOUNTER — Telehealth: Payer: Self-pay

## 2023-01-22 NOTE — Progress Notes (Deleted)
[facility]   Telephone:([phone] Fax:([phone]) [phone]   Clinic Follow up Note   Patient Care Team: Lorelei Pont, DO as PCP - General (Family Medicine) Emelia Loron, MD as Consulting Physician (General Surgery) Malachy Mood, MD as Consulting Physician (Hematology) Antony Blackbird, MD as Consulting Physician (Radiation Oncology)  Date of Service:  01/22/2023  CHIEF COMPLAINT: f/u of {diagnosis, copy from prior note}  CURRENT THERAPY:  {Usually copy/paste from prior note, but pay attention to if treatment changes. Or change to Surveillance if they finished treatment}  ASSESSMENT: *** Jaime Howard is a 47 y.o. female with   No problem-specific Assessment & Plan notes found for this encounter.  ***   PLAN: {Everything Dr. Mosetta Putt talks to pt about, including reviewing scans and labs. } -{proceed with ***} -{lab with/without flush and f/u when?}   SUMMARY OF ONCOLOGIC HISTORY: Oncology History  Ductal carcinoma in situ (DCIS) of left breast  03/06/2022 Mammogram   Exam: 3D Mammogram Diagnostic - Left  IMPRESSION: The 4 cm segmental pleomorphic calcifications in the left breast at 3-4 o'clock are suspicious of malignancy.   03/18/2022 Initial Biopsy   Diagnosis 1. Breast, left, needle core biopsy, calcification 3-4:00 middle depth  - DUCTAL CARCINOMA IN SITU (DCIS), APOCRINE TYPE, NUCLEAR GRADE I-II, WITH CALCIFICATIONS AND CENTRAL NECROSIS  1. PROGNOSTIC INDICATORS Results: Estrogen Receptor: 90%, POSITIVE, STRONG STAINING INTENSITY Progesterone Receptor: 50%, POSITIVE, STRONG STAINING INTENSITY   03/21/2022 Initial Diagnosis   Ductal carcinoma in situ (DCIS) of left breast    Genetic Testing   [facility] CancerNext-Expanded Panel was Negative. Report date is 04/06/2022.  The CancerNext-Expanded gene panel offered by [facility] and includes sequencing, rearrangement, and RNA analysis for the following 77 genes: AIP, ALK, APC, ATM, AXIN2, BAP1, BARD1,  BLM, BMPR1A, BRCA1, BRCA2, BRIP1, CDC73, CDH1, CDK4, CDKN1B, CDKN2A, CHEK2, CTNNA1, DICER1, FANCC, FH, FLCN, GALNT12, KIF1B, LZTR1, MAX, MEN1, MET, MLH1, MSH2, MSH3, MSH6, MUTYH, NBN, NF1, NF2, NTHL1, PALB2, PHOX2B, PMS2, POT1, PRKAR1A, PTCH1, PTEN, RAD51C, RAD51D, RB1, RECQL, RET, SDHA, SDHAF2, SDHB, SDHC, SDHD, SMAD4, SMARCA4, SMARCB1, SMARCE1, STK11, SUFU, TMEM127, TP53, TSC1, TSC2, VHL and XRCC2 (sequencing and deletion/duplication); EGFR, EGLN1, HOXB13, KIT, MITF, PDGFRA, POLD1, and POLE (sequencing only); EPCAM and GREM1 (deletion/duplication only).    04/10/2022 Cancer Staging   Staging form: Breast, AJCC 8th Edition - Pathologic stage from 04/10/2022: pTis (DCIS), pN0, G2, ER+, PR+, HER2: Not Assessed - Signed by Loa Socks, NP on 08/08/2022 Stage prefix: Initial diagnosis Histologic grading system: 3 grade system Residual tumor (R): R0 - None   04/10/2022 Surgery   Left lumpectomy: patchy intermediate grade DCIS, margins negative, no invasive cancer noted.    05/07/2022 -  Anti-estrogen oral therapy   10 mg Tamoxifen      INTERVAL HISTORY: *** Jaime Howard is here for a follow up of {diagnosis} She was last seen by {provider} on {date} She presents to the clinic {alone/accompanied by}. {Everything the pt says, basically. How they're feeling, complaints and concerns, etc}   All other systems were reviewed with the patient and are negative.  MEDICAL HISTORY:  Past Medical History:  Diagnosis Date   Breast cancer (HCC)    Rheumatoid arthritis (HCC)     SURGICAL HISTORY: Past Surgical History:  Procedure Laterality Date   BREAST LUMPECTOMY WITH RADIOACTIVE SEED LOCALIZATION Left 04/10/2022   Procedure: RADIOACTIVE SEED BRACKETED LEFT BREAST LUMPECTOMY;  Surgeon: Emelia Loron, MD;  Location: [facility];  Service: General;  Laterality: Left;   FOOT  SURGERY Left    TUBAL LIGATION  11/1998    I have reviewed the social history and family  history with the patient and they are unchanged from previous note.  ALLERGIES:  has No Known Allergies.  MEDICATIONS:  Current Outpatient Medications  Medication Sig Dispense Refill   acetaminophen (TYLENOL) 325 MG tablet Take 650 mg by mouth every 6 (six) hours as needed for mild pain or headache.     meloxicam (MOBIC) 15 MG tablet Take 15 mg by mouth as needed for pain.     ondansetron (ZOFRAN-ODT) 4 MG disintegrating tablet Take 1 tablet (4 mg total) by mouth every 8 (eight) hours as needed for nausea or vomiting. 20 tablet 0   tamoxifen (NOLVADEX) 10 MG tablet TAKE 1 TABLET BY MOUTH EVERY DAY 90 tablet 1   traMADol (ULTRAM) 50 MG tablet Take 1 tablet (50 mg total) by mouth every 6 (six) hours as needed. 10 tablet 0   No current facility-administered medications for this visit.    PHYSICAL EXAMINATION: ECOG PERFORMANCE STATUS: {CHL ONC ECOG PS:[phone]}  There were no vitals filed for this visit. Wt Readings from Last 3 Encounters:  05/07/22 143 lb 1.6 oz (64.9 kg)  04/10/22 141 lb 1.5 oz (64 kg)  03/27/22 142 lb 1.6 oz (64.5 kg)    {Only keep what was examined. If exam not performed, can use .CEXAM } GENERAL:alert, no distress and comfortable SKIN: skin color, texture, turgor are normal, no rashes or significant lesions EYES: normal, Conjunctiva are pink and non-injected, sclera clear {OROPHARYNX:no exudate, no erythema and lips, buccal mucosa, and tongue normal}  NECK: supple, thyroid normal size, non-tender, without nodularity LYMPH:  no palpable lymphadenopathy in the cervical, axillary {or inguinal} LUNGS: clear to auscultation and percussion with normal breathing effort HEART: regular rate & rhythm and no murmurs and no lower extremity edema ABDOMEN:abdomen soft, non-tender and normal bowel sounds Musculoskeletal:no cyanosis of digits and no clubbing  NEURO: alert & oriented x 3 with fluent speech, no focal motor/sensory deficits  LABORATORY DATA:  I have reviewed  the data as listed    Latest Ref Rng & Units 03/27/2022   12:10 PM  CBC  WBC 4.0 - 10.5 K/uL 7.0   Hemoglobin 12.0 - 15.0 g/dL 40.915.1   Hematocrit 81.136.0 - 46.0 % 44.9   Platelets 150 - 400 K/uL 182         Latest Ref Rng & Units 03/27/2022   12:10 PM  CMP  Glucose 70 - 99 mg/dL 75   BUN 6 - 20 mg/dL 12   Creatinine 9.140.44 - 1.00 mg/dL 7.820.88   Sodium 956135 - 213145 mmol/L 138   Potassium 3.5 - 5.1 mmol/L 3.6   Chloride 98 - 111 mmol/L 104   CO2 22 - 32 mmol/L 27   Calcium 8.9 - 10.3 mg/dL 9.7   Total Protein 6.5 - 8.1 g/dL 7.9   Total Bilirubin 0.3 - 1.2 mg/dL 0.5   Alkaline Phos 38 - 126 U/L 58   AST 15 - 41 U/L 14   ALT 0 - 44 U/L 7       RADIOGRAPHIC STUDIES: I have personally reviewed the radiological images as listed and agreed with the findings in the report. No results found.    No orders of the defined types were placed in this encounter.  All questions were answered. The patient knows to call the clinic with any problems, questions or concerns. No barriers to learning was detected. The total time  spent in the appointment was {CHL ONC TIME VISIT - JDYNX:[id number]}.     Salome Holmes, CMA 01/22/2023   I, Monica Martinez, CMA, am acting as scribe for Malachy Mood, MD.   {Add scribe attestation statement}

## 2023-01-22 NOTE — Assessment & Plan Note (Deleted)
grade 2-3, ER+/PR+ -found on screening mammogram. S/p left lumpectomy 04/10/22 under Dr. Dwain Sarna, path showed patchy intermediate grade DCIS with apocrine features, margins negative. -Her DCIS was cured by complete surgical resection. Any form of adjuvant therapy is preventive. She would prefer to avoid radiation therapy. -Patient declined adjuvant radiation. -Given her strongly positive ER and PR and premenopausal status, I recommend antiestrogen therapy with tamoxifen, she started in 04/2022. She has not been seen since her last visit in 04/2022.

## 2023-01-22 NOTE — Telephone Encounter (Signed)
I called Ms. Jaime Howard to see if she was aware of her lab appointment and f/u with Dr. Mosetta Putt. Pt did not answer the phone so I left a voice mail, letting the pt know that she can give [place] a call at [phone] if she has any question.

## 2023-02-04 ENCOUNTER — Telehealth: Payer: Self-pay | Admitting: Hematology

## 2023-02-04 NOTE — Telephone Encounter (Signed)
Contacted patient to scheduled appointments. Left message for patient to call back to schedule missed appointments.

## 2023-04-22 ENCOUNTER — Telehealth: Payer: Self-pay

## 2023-04-22 NOTE — Telephone Encounter (Signed)
MTG-015 - Tissue and Bodily Fluids: Translational Medicine: Discovery and Evaluation of Biomarkers/Pharmacogenomics for the Diagnosis and Personalized Management of Patients   Attempted to reach patient for her one year follow up phone call. No answer, left message.  Margret Chance Mete Purdum, RN, BSN, [facility] She  Her  Hers Clinical Research Nurse [facility] Direct Dial [phone]  Pager [phone] 04/22/2023 1:55 PM

## 2023-04-29 ENCOUNTER — Telehealth: Payer: Self-pay

## 2023-04-29 NOTE — Telephone Encounter (Signed)
MTG-015 - Tissue and Bodily Fluids: Translational Medicine: Discovery and Evaluation of Biomarkers/Pharmacogenomics for the Diagnosis and Personalized Management of Patients   Called patient for one year follow up. No answer, VM full, unable to leave message.  Margret Chance Evely Gainey, RN, BSN, [facility] She  Her  Hers Clinical Research Nurse [facility] Direct Dial [phone]  Pager [phone] 04/29/2023 2:08 PM

## 2023-05-01 ENCOUNTER — Telehealth: Payer: Self-pay

## 2023-05-01 NOTE — Telephone Encounter (Signed)
MTG-015 - Tissue and Bodily Fluids: Translational Medicine: Discovery and Evaluation of Biomarkers/Pharmacogenomics for the Diagnosis and Personalized Management of Patients   Attempted to reach patient for one year follow up; no answer, left VM.  Margret Chance Bernadett Milian, RN, BSN, [facility] She  Her  Hers Clinical Research Nurse [facility] Direct Dial [phone]  Pager [phone] 05/01/2023 2:18 PM

## 2023-05-06 ENCOUNTER — Telehealth: Payer: Self-pay

## 2023-05-06 NOTE — Telephone Encounter (Signed)
MTG-015 - Tissue and Bodily Fluids: Translational Medicine: Discovery and Evaluation of Biomarkers/Pharmacogenomics for the Diagnosis and Personalized Management of Patients    Attempted to reach patient for one year follow up; no answer, VM full.  Margret Chance Brooklyne Radke, RN, BSN, [facility] She  Her  Hers Clinical Research Nurse [facility] Direct Dial [phone]  Pager [phone] 05/06/2023 12:39 PM

## 2024-04-06 ENCOUNTER — Encounter: Payer: Self-pay | Admitting: Hematology

## 2024-04-12 ENCOUNTER — Other Ambulatory Visit: Payer: Self-pay

## 2024-04-12 ENCOUNTER — Other Ambulatory Visit: Payer: Self-pay | Admitting: *Deleted

## 2024-04-12 DIAGNOSIS — D0512 Intraductal carcinoma in situ of left breast: Secondary | ICD-10-CM

## 2024-04-12 DIAGNOSIS — N6320 Unspecified lump in the left breast, unspecified quadrant: Secondary | ICD-10-CM

## 2024-04-12 DIAGNOSIS — R928 Other abnormal and inconclusive findings on diagnostic imaging of breast: Secondary | ICD-10-CM

## 2024-04-12 NOTE — Progress Notes (Signed)
 Opened by mistake.

## 2024-05-07 ENCOUNTER — Encounter: Payer: Self-pay | Admitting: Hematology
